# Patient Record
Sex: Male | Born: 1985
Health system: Southern US, Community
[De-identification: ages and names within clinical notes are randomized; demographics above are authoritative.]

## PROBLEM LIST (undated history)

## (undated) HISTORY — PX: APPENDECTOMY: SHX54

---

## 2008-08-23 ENCOUNTER — Emergency Department (HOSPITAL_COMMUNITY): Admission: EM | Admit: 2008-08-23 | Discharge: 2008-08-23 | Payer: Self-pay | Admitting: Emergency Medicine

## 2014-06-20 ENCOUNTER — Ambulatory Visit: Payer: Self-pay

## 2014-07-20 ENCOUNTER — Ambulatory Visit: Payer: Self-pay

## 2014-08-30 ENCOUNTER — Encounter (HOSPITAL_COMMUNITY): Payer: Self-pay

## 2014-08-30 ENCOUNTER — Emergency Department (HOSPITAL_COMMUNITY)
Admission: EM | Admit: 2014-08-30 | Discharge: 2014-08-30 | Disposition: A | Discharge status: Home / Self Care | Payer: Self-pay | Attending: Emergency Medicine | Admitting: Emergency Medicine

## 2014-08-30 ENCOUNTER — Emergency Department (HOSPITAL_COMMUNITY): Payer: Self-pay

## 2014-08-30 DIAGNOSIS — J159 Unspecified bacterial pneumonia: Secondary | ICD-10-CM | POA: Insufficient documentation

## 2014-08-30 DIAGNOSIS — J189 Pneumonia, unspecified organism: Secondary | ICD-10-CM

## 2014-08-30 DIAGNOSIS — R059 Cough, unspecified: Secondary | ICD-10-CM

## 2014-08-30 DIAGNOSIS — R05 Cough: Secondary | ICD-10-CM

## 2014-08-30 DIAGNOSIS — Z79899 Other long term (current) drug therapy: Secondary | ICD-10-CM | POA: Insufficient documentation

## 2014-08-30 MED ORDER — ALBUTEROL SULFATE HFA 108 (90 BASE) MCG/ACT IN AERS: 2.0000 | INHALATION_SPRAY | RESPIRATORY_TRACT | Status: AC | PRN

## 2014-08-30 MED ORDER — AZITHROMYCIN 250 MG PO TABS: 250.0000 mg | ORAL_TABLET | Freq: Every day | ORAL | Status: DC

## 2014-08-30 NOTE — ED Notes (Signed)
Pt pulled into hallway. Unable to esign. Pt verbalized understanding of discharge instructions.

## 2014-08-30 NOTE — ED Provider Notes (Signed)
CSN: [id number]     Arrival date & time 08/30/14  1134 History   First MD Initiated Contact with Patient 08/30/14 1213     Chief Complaint  Patient presents with  . Cough     (Consider location/radiation/quality/duration/timing/severity/associated sxs/prior Treatment) HPI  Daniel Klein is a 28 y.o. male without significant PMH presenting with a productive cough of white thick mucous for seven days. Pt notes small red streaks in sputum today. No other episodes. Patient also with tactile fevers, chills, sore throats generalized body aches and rhinorrhea. Patient also has chest tightness only after he coughs. Patient has no cardiac history. No history of COPD, smoking, asthma. No nausea or vomiting or abdominal pain. Patient is eating and drinking like normal. No history of blood clots, recent trauma, surgery or unilateral leg swelling.   History reviewed. No pertinent past medical history. Past Surgical History  Procedure Laterality Date  . Appendectomy     No family history on file. History  Substance Use Topics  . Smoking status: Never Smoker   . Smokeless tobacco: Not on file  . Alcohol Use: Yes     Comment: occ    Review of Systems  Constitutional: Negative for fever and chills.  HENT: Negative for congestion and rhinorrhea.   Eyes: Negative for visual disturbance.  Respiratory: Negative for cough and shortness of breath.   Cardiovascular: Negative for chest pain and palpitations.  Gastrointestinal: Negative for nausea, vomiting and diarrhea.  Musculoskeletal: Negative for back pain and gait problem.  Skin: Negative for rash.  Neurological: Negative for weakness and headaches.      Allergies  Review of patient's allergies indicates no known allergies.  Home Medications   Prior to Admission medications   Medication Sig Start Date End Date Taking? Authorizing Provider  ibuprofen (ADVIL,MOTRIN) 200 MG tablet Take 600 mg by mouth every 6 (six) hours as  needed.   Yes Historical Provider, MD  Pseudoeph-Doxylamine-DM-APAP (NYQUIL PO) Take 2 capsules by mouth at bedtime as needed.   Yes Historical Provider, MD  Pseudoephedrine-APAP-DM (DAYQUIL PO) Take 2 capsules by mouth daily.   Yes Historical Provider, MD  albuterol (PROVENTIL HFA;VENTOLIN HFA) 108 (90 BASE) MCG/ACT inhaler Inhale 2 puffs into the lungs every 4 (four) hours as needed for wheezing or shortness of breath. 08/30/14   Louann SjogrenVictoria L Seaton Hofmann, PA-C  azithromycin (ZITHROMAX) 250 MG tablet Take 1 tablet (250 mg total) by mouth daily. Take first 2 tablets together, then 1 every day until finished. 08/30/14   Benetta SparVictoria L Adrion Menz, PA-C   BP 132/75 mmHg  Pulse 59  Temp(Src) 98.7 F (37.1 C) (Oral)  Resp 19  Ht 5\' 11"  (1.803 m)  Wt 200 lb (90.719 kg)  BMI 27.91 kg/m2  SpO2 96% Physical Exam  Constitutional: He appears well-developed and well-nourished. No distress.  HENT:  Head: Normocephalic and atraumatic.  Nose: Right sinus exhibits no maxillary sinus tenderness and no frontal sinus tenderness. Left sinus exhibits no maxillary sinus tenderness and no frontal sinus tenderness.  Mouth/Throat: Mucous membranes are normal. Posterior oropharyngeal erythema present. No oropharyngeal exudate or posterior oropharyngeal edema.  Eyes: Conjunctivae and EOM are normal. Right eye exhibits no discharge. Left eye exhibits no discharge.  Neck: Normal range of motion. Neck supple.  Cardiovascular: Normal rate, regular rhythm and normal heart sounds.   Pulmonary/Chest: Effort normal. No respiratory distress. He has no wheezes.  Diffuse rales  Abdominal: Soft. Bowel sounds are normal. He exhibits no distension. There is no tenderness.  Lymphadenopathy:  He has no cervical adenopathy.  Neurological: He is alert.  Skin: Skin is warm and dry. He is not diaphoretic.  Nursing note and vitals reviewed.   ED Course  Procedures (including critical care time) Labs Review Labs Reviewed - No data to  display  Imaging Review Dg Chest 2 View  08/30/2014   CLINICAL DATA:  Productive cough.  Chest pain.  EXAM: CHEST  2 VIEW  COMPARISON:  None.  FINDINGS: Abnormal perihilar interstitial and nodular opacities, mildly more prominent on the right than the left. Low lung volumes. Cardiac and mediastinal margins appear normal. Central airway thickening noted.  IMPRESSION: 1. Airway thickening with perihilar interstitial and nodular opacities, right greater than left. Appearance favors atypical pneumonia with early bronchopneumonia as a less likely differential diagnostic consideration. If the patient has risk factors for aspiration been aspiration pneumonitis might also be considered.   Electronically Signed   By: Herbie BaltimoreWalt  Liebkemann M.D.   On: 08/30/2014 12:43     EKG Interpretation None      Meds given in ED:  Medications - No data to display  Discharge Medication List as of 08/30/2014  1:16 PM    START taking these medications   Details  albuterol (PROVENTIL HFA;VENTOLIN HFA) 108 (90 BASE) MCG/ACT inhaler Inhale 2 puffs into the lungs every 4 (four) hours as needed for wheezing or shortness of breath., Starting 08/30/2014, Until Discontinued, Print    azithromycin (ZITHROMAX) 250 MG tablet Take 1 tablet (250 mg total) by mouth daily. Take first 2 tablets together, then 1 every day until finished., Starting 08/30/2014, Until Discontinued, Print          MDM   Final diagnoses:  CAP (community acquired pneumonia)   Patient has been diagnosed with CAP via chest xray. Pt with chest tightness in the setting of PNA. Pt without cardiac history or chest pain and I doubt ACS. Pt also with one episode hemoptysis in the setting of PNA which I suspect is the cause. Pt is not ill appearing, immunocompromised, and does not have multiple co morbidities, therefore I feel like the they can be treated as an OP with abx therapy. PNA atypical and tx with azithromycin. Pt has been advised to return to the ED if  symptoms worsen or they do not improve. Pt to follow up with the wellness center. Pt verbalizes understanding and is agreeable with plan.        Louann SjogrenVictoria L Shawnika Pepin, PA-C 08/30/14 1955  Flint MelterElliott L Wentz, MD 08/31/14 [phone]

## 2014-08-30 NOTE — Discharge Instructions (Signed)
Return to the emergency room with worsening of symptoms, new symptoms or with symptoms that are concerning, especially persistent fevers after 24 hours, nausea, vomiting, unable to tolerate fluids, stiff neck, worsening headache, nausea/vomiting, visual changes or slurred speech, chest pain, shortness of breath, cough with blood. Please take all of your antibiotics until finished!   You may develop abdominal discomfort or diarrhea from the antibiotic.  You may help offset this with probiotics which you can buy or get in yogurt. Do not eat  or take the probiotics until 2 hours after your antibiotic.  Drink plenty of fluids with electrolytes especially Gatorade. OTC cold medications such as mucinex, nyquil, dayquil are recommended. Chloraseptic for sore throat. Follow up with wellness center in 3 days. Call to make appointment. Inhaler for shortness of breath or chest tightness.

## 2014-08-30 NOTE — ED Notes (Signed)
Pt has been coughing since last Wednesday and it isn't getting any better. Coughs hard and makes his back and chest sore when coughing.

## 2014-09-07 ENCOUNTER — Ambulatory Visit: Payer: Self-pay | Attending: Internal Medicine | Admitting: Internal Medicine

## 2014-09-07 ENCOUNTER — Encounter: Payer: Self-pay | Admitting: Internal Medicine

## 2014-09-07 VITALS — BP 110/75 | HR 66 | Temp 98.8°F | Resp 16 | Ht 70.0 in | Wt 218.0 lb

## 2014-09-07 DIAGNOSIS — Z114 Encounter for screening for human immunodeficiency virus [HIV]: Secondary | ICD-10-CM | POA: Insufficient documentation

## 2014-09-07 DIAGNOSIS — Z0000 Encounter for general adult medical examination without abnormal findings: Secondary | ICD-10-CM | POA: Insufficient documentation

## 2014-09-07 DIAGNOSIS — Z113 Encounter for screening for infections with a predominantly sexual mode of transmission: Secondary | ICD-10-CM | POA: Insufficient documentation

## 2014-09-07 LAB — COMPLETE METABOLIC PANEL WITH GFR
ALT: 40 U/L (ref 0–53)
AST: 23 U/L (ref 0–37)
Albumin: 4.8 g/dL (ref 3.5–5.2)
Alkaline Phosphatase: 71 U/L (ref 39–117)
BILIRUBIN TOTAL: 0.8 mg/dL (ref 0.2–1.2)
BUN: 13 mg/dL (ref 6–23)
CHLORIDE: 102 meq/L (ref 96–112)
CO2: 24 mEq/L (ref 19–32)
Calcium: 9.6 mg/dL (ref 8.4–10.5)
Creat: 0.85 mg/dL (ref 0.50–1.35)
GFR, Est Non African American: >89 mL/min
Glucose, Bld: 84 mg/dL (ref 70–99)
Potassium: 4.5 mEq/L (ref 3.5–5.3)
SODIUM: 138 meq/L (ref 135–145)
Total Protein: 7.5 g/dL (ref 6.0–8.3)

## 2014-09-07 LAB — CBC
HCT: 44.9 % (ref 39.0–52.0)
HEMOGLOBIN: 16.1 g/dL (ref 13.0–17.0)
MCH: 32.7 pg (ref 26.0–34.0)
MCHC: 35.9 g/dL (ref 30.0–36.0)
MCV: 91.3 fL (ref 78.0–100.0)
MPV: 9.1 fL — AB (ref 9.4–12.4)
PLATELETS: 348 10*3/uL (ref 150–400)
RBC: 4.92 MIL/uL (ref 4.22–5.81)
RDW: 13.4 % (ref 11.5–15.5)
WBC: 6.9 10*3/uL (ref 4.0–10.5)

## 2014-09-07 LAB — LIPID PANEL
CHOL/HDL RATIO: 4.4 ratio
Cholesterol: 133 mg/dL (ref 0–200)
HDL: 30 mg/dL — ABNORMAL LOW (ref >39)
LDL CALC: 46 mg/dL (ref 0–99)
Triglycerides: 285 mg/dL — ABNORMAL HIGH (ref <150)
VLDL: 57 mg/dL — AB (ref 0–40)

## 2014-09-07 LAB — POCT URINALYSIS DIPSTICK
BILIRUBIN UA: NEGATIVE
Blood, UA: NEGATIVE
Glucose, UA: NEGATIVE
KETONES UA: NEGATIVE
LEUKOCYTES UA: NEGATIVE
Nitrite, UA: NEGATIVE
PROTEIN UA: NEGATIVE
SPEC GRAV UA: 1.02
Urobilinogen, UA: 0.2
pH, UA: 5.5

## 2014-09-07 LAB — POCT GLYCOSYLATED HEMOGLOBIN (HGB A1C): Hemoglobin A1C: 5.5

## 2014-09-07 LAB — GLUCOSE, POCT (MANUAL RESULT ENTRY): POC Glucose: 98 mg/dl (ref 70–99)

## 2014-09-07 NOTE — Progress Notes (Signed)
Patient ID: Daniel Klein, male   DOB: 1986-05-06, 28 y.o.   MRN: [id number]  [id number]  [id number]  DOB - 1986-05-06  CC:  Chief Complaint  Patient presents with  . Establish Care       HPI: Daniel Klein is a 28 y.o. male here today to establish medical care.  Patient was recently evaluated by the ER for community acquired pneumonia.  He has now completed all antibiotics (azithromycin).  He reports that he has has been having symptoms of frequent urination, dizziness, sweating for the past three months.     PMHx:pnemonia Surgical Hx: appendectomy Family history of hypertension and cancer---leukemia in brother Soc Hx: denies tobacco and illicit drug use. Occasional alcohol use.    No Known Allergies History reviewed. No pertinent past medical history. Current Outpatient Prescriptions on File Prior to Visit  Medication Sig Dispense Refill  . albuterol (PROVENTIL HFA;VENTOLIN HFA) 108 (90 BASE) MCG/ACT inhaler Inhale 2 puffs into the lungs every 4 (four) hours as needed for wheezing or shortness of breath. 1 Inhaler 0  . azithromycin (ZITHROMAX) 250 MG tablet Take 1 tablet (250 mg total) by mouth daily. Take first 2 tablets together, then 1 every day until finished. 6 tablet 0  . ibuprofen (ADVIL,MOTRIN) 200 MG tablet Take 600 mg by mouth every 6 (six) hours as needed.    . Pseudoeph-Doxylamine-DM-APAP (NYQUIL PO) Take 2 capsules by mouth at bedtime as needed.    . Pseudoephedrine-APAP-DM (DAYQUIL PO) Take 2 capsules by mouth daily.     No current facility-administered medications on file prior to visit.   Family History  Problem Relation Age of Onset  . Hypertension Mother   . Cancer Brother    History   Social History  . Marital Status: Single    Spouse Name: N/A    Number of Children: N/A  . Years of Education: N/A   Occupational History  . Not on file.   Social History Main Topics  . Smoking status: Never Smoker   . Smokeless tobacco:  Not on file  . Alcohol Use: Yes     Comment: occ  . Drug Use: Not on file  . Sexual Activity: Not on file   Other Topics Concern  . Not on file   Social History Narrative    .Review of Systems  Constitutional: Negative.   HENT: Negative.   Eyes: Negative.   Respiratory: Positive for cough and shortness of breath.   Cardiovascular: Negative.   Gastrointestinal: Positive for diarrhea.  Genitourinary: Negative.   Skin: Negative.   Neurological: Positive for tingling (left leg numbness for 4 months). Negative for dizziness, focal weakness and seizures.  Endo/Heme/Allergies: Negative.   Psychiatric/Behavioral: Negative.       Objective:   Filed Vitals:   09/07/14 1212  BP: 110/75  Pulse: 66  Temp: 98.8 F (37.1 C)  Resp: 16    Physical Exam: Constitutional: Patient appears well-developed and well-nourished. No distress. HENT: Normocephalic, atraumatic, External right and left ear normal. Oropharynx is clear and moist.  Eyes: Conjunctivae and EOM are normal. PERRLA, no scleral icterus. Neck: Normal ROM. Neck supple. No JVD. No tracheal deviation. No thyromegaly. CVS: RRR, S1/S2 +, no murmurs, no gallops, no carotid bruit.  Pulmonary: Effort and breath sounds normal, no stridor, rhonchi, wheezes, rales.  Abdominal: Soft. BS +, no distension, tenderness, rebound or guarding.  Musculoskeletal: Normal range of motion. No edema and no tenderness.  Lymphadenopathy: No lymphadenopathy noted, cervical, inguinal or axillary Neuro: Alert. Normal reflexes,  muscle tone coordination. No cranial nerve deficit. Skin: Skin is warm and dry. No rash noted. Not diaphoretic. No erythema. No pallor. Psychiatric: Normal mood and affect. Behavior, judgment, thought content normal. Physical Exam  Genitourinary: Testes normal. Cremasteric reflex is present.        No results found for: WBC, HGB, HCT, MCV, PLT No results found for: CREATININE, BUN, NA, K, CL, CO2  No results found for:  HGBA1C Lipid Panel  No results found for: CHOL, TRIG, HDL, CHOLHDL, VLDL, LDLCALC     Assessment and plan:   Daniel MausGerardo was seen today for establish care.  Diagnoses and associated orders for this visit:  Annual physical exam - Glucose (CBG) - HgB A1c - POCT urinalysis dipstick - Lipid panel - CBC - COMPLETE METABOLIC PANEL WITH GFR  Screening for STD (sexually transmitted disease) - GC/chlamydia probe amp, urine - HIV antibody (with reflex) - RPR   May follow up if symptoms do not improve.  The patient was given clear instructions to go to ER or return to medical center if symptoms don't improve, worsen or new problems develop. The patient verbalized understanding.   Due to language barrier, an interpreter was present during the history-taking and subsequent discussion (and for part of the physical exam) with this patient.    Daniel CommonsKECK, Daniel Whitefield, NP-C [facility] [phone] 09/07/2014, 12:29 PM

## 2014-09-07 NOTE — Progress Notes (Signed)
Pr is here to establish care. Pt was in the ED recently and was diagnosed w/ pneumonia. Pt is currently fasting. Pt wants to be tested for diabetes and HTN.

## 2014-09-08 LAB — RPR

## 2014-09-08 LAB — GC/CHLAMYDIA PROBE AMP, URINE
Chlamydia, Swab/Urine, PCR: NEGATIVE
GC Probe Amp, Urine: NEGATIVE

## 2014-09-08 LAB — HIV ANTIBODY (ROUTINE TESTING W REFLEX): HIV: NONREACTIVE

## 2014-09-12 ENCOUNTER — Telehealth: Payer: Self-pay | Admitting: Internal Medicine

## 2014-09-12 ENCOUNTER — Telehealth: Payer: Self-pay | Admitting: *Deleted

## 2014-09-12 NOTE — Telephone Encounter (Signed)
Pt.'s wife called to request results. Please f/u with pt.

## 2014-09-12 NOTE — Telephone Encounter (Signed)
Pt is aware of his lab results. 

## 2016-12-10 ENCOUNTER — Ambulatory Visit: Payer: Self-pay

## 2016-12-10 ENCOUNTER — Ambulatory Visit: Payer: Self-pay | Admitting: Family Medicine

## 2016-12-28 ENCOUNTER — Ambulatory Visit (HOSPITAL_COMMUNITY)
Admission: RE | Admit: 2016-12-28 | Discharge: 2016-12-28 | Disposition: A | Discharge status: Home / Self Care | Payer: Self-pay | Source: Ambulatory Visit | Attending: Family Medicine | Admitting: Family Medicine

## 2016-12-28 ENCOUNTER — Telehealth: Payer: Self-pay | Admitting: Family Medicine

## 2016-12-28 ENCOUNTER — Ambulatory Visit: Payer: Self-pay | Attending: Family Medicine | Admitting: Family Medicine

## 2016-12-28 VITALS — BP 144/73 | HR 63 | Temp 98.0°F | Resp 18 | Ht 70.0 in | Wt 236.2 lb

## 2016-12-28 DIAGNOSIS — R202 Paresthesia of skin: Secondary | ICD-10-CM

## 2016-12-28 DIAGNOSIS — M238X1 Other internal derangements of right knee: Secondary | ICD-10-CM | POA: Insufficient documentation

## 2016-12-28 DIAGNOSIS — M1711 Unilateral primary osteoarthritis, right knee: Secondary | ICD-10-CM | POA: Insufficient documentation

## 2016-12-28 DIAGNOSIS — M25562 Pain in left knee: Secondary | ICD-10-CM | POA: Insufficient documentation

## 2016-12-28 DIAGNOSIS — R200 Anesthesia of skin: Secondary | ICD-10-CM | POA: Insufficient documentation

## 2016-12-28 DIAGNOSIS — Z23 Encounter for immunization: Secondary | ICD-10-CM

## 2016-12-28 DIAGNOSIS — M25561 Pain in right knee: Secondary | ICD-10-CM | POA: Insufficient documentation

## 2016-12-28 DIAGNOSIS — G8929 Other chronic pain: Secondary | ICD-10-CM

## 2016-12-28 MED ORDER — IBUPROFEN 800 MG PO TABS: 800.0000 mg | ORAL_TABLET | Freq: Three times a day (TID) | ORAL | 0 refills | Status: DC | PRN

## 2016-12-28 MED FILL — IBUPROFEN 800 MG TABLET: 800 | 13 days supply | Qty: 40 | Fill #0

## 2016-12-28 NOTE — Progress Notes (Signed)
Subjective:  Patient ID: Daniel Klein, male    DOB: 10-Dec-1985  Age: 31 y.o. MRN: [id number]  CC: Establish Care   HPI Daniel Klein presents for  Right knee pain: 2 months. Denies injury. 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 Symptoms include crepitius when bending forward and parathesias from the knee down the right lower extremity . Denies any swelling and tenderness. Pain 8/10 most days. Reports taking OTC ibuprofen for pain.    Outpatient Medications Prior to Visit  Medication Sig Dispense Refill  . ibuprofen (ADVIL,MOTRIN) 200 MG tablet Take 600 mg by mouth every 6 (six) hours as needed.    Marland Kitchen albuterol (PROVENTIL HFA;VENTOLIN HFA) 108 (90 BASE) MCG/ACT inhaler Inhale 2 puffs into the lungs every 4 (four) hours as needed for wheezing or shortness of breath. 1 Inhaler 0  . azithromycin (ZITHROMAX) 250 MG tablet Take 1 tablet (250 mg total) by mouth daily. Take first 2 tablets together, then 1 every day until finished. 6 tablet 0  . Pseudoeph-Doxylamine-DM-APAP (NYQUIL PO) Take 2 capsules by mouth at bedtime as needed.    . Pseudoephedrine-APAP-DM (DAYQUIL PO) Take 2 capsules by mouth daily.     No facility-administered medications prior to visit.     ROS Review of Systems  Respiratory: Negative.   Cardiovascular: Negative.   Musculoskeletal: Positive for arthralgias.  Skin: Negative.     Objective:  BP (!) 144/73 (BP Location: Left Arm, Patient Position: Sitting, Cuff Size: Normal)   Pulse 63   Temp 98 F (36.7 C) (Oral)   Resp 18   Ht 5\' 10"  (1.778 m)   Wt 236 lb 3.2 oz (107.1 kg)   SpO2 98%   BMI 33.89 kg/m   BP/Weight 12/28/2016 09/07/2014 08/30/2014  Systolic BP 144 110 132  Diastolic BP 73 75 75  Wt. (Lbs) 236.2 218 200  BMI 33.89 31.28 27.91    Physical Exam  Cardiovascular: Normal rate, regular rhythm, normal heart sounds and intact distal pulses.   Pulses:      Dorsalis pedis pulses are 2+ on the right side, and 2+ on the left  side.       Posterior tibial pulses are 2+ on the right side, and 2+ on the left side.  Pulmonary/Chest: Effort normal and breath sounds normal.  Musculoskeletal:       Right knee: He exhibits bony tenderness. He exhibits no swelling.       Left knee: Normal.  Right knee pain with extension and flexion.   Skin: Skin is warm and dry.  Nursing note and vitals reviewed.   Assessment & Plan:   Problem List Items Addressed This Visit    None    Visit Diagnoses    Crepitus of joint of right knee    -  Primary   Relevant Orders   DG Knee Complete 4 Views Right (Completed)   Ambulatory referral to Orthopedics   Chronic pain of left knee       -Script for right knee brace given.    Relevant Medications   ibuprofen (ADVIL,MOTRIN) 800 MG tablet   Other Relevant Orders   DG Knee Complete 4 Views Right (Completed)   Ambulatory referral to Orthopedics   Numbness and tingling       Relevant Orders   DG Knee Complete 4 Views Right (Completed)   Ambulatory referral to Orthopedics   Needs flu shot       Relevant Orders   Flu Vaccine QUAD 36+ mos PF IM (Fluarix & Fluzone  Quad PF) (Completed)      Meds ordered this encounter  Medications  . ibuprofen (ADVIL,MOTRIN) 800 MG tablet    Sig: Take 1 tablet (800 mg total) by mouth every 8 (eight) hours as needed (Take with food.).    Dispense:  40 tablet    Refill:  0    Order Specific Question:   Supervising Provider    Answer:   Quentin AngstJEGEDE, OLUGBEMIGA E [id number]]    Follow-up: Return if symptoms worsen or fail to improve.   Lizbeth BarkMandesia R Ladon Vandenberghe FNP

## 2016-12-28 NOTE — Progress Notes (Signed)
Patient complains right knee pain all the way to his foot   Patient takes ibuprofen for the pain  Patient has not eaten today  Patient is not taking any current meds

## 2016-12-28 NOTE — Patient Instructions (Addendum)
Apply for orange card to complete referral process    Dolor en las articulaciones (Joint Pain) El dolor en las articulaciones, que tambin se conoce como artralgia, puede tener muchas causas. Suele desaparecer cuando se siguen las indicaciones del mdico en lo que respecta a Engineer, materials en la casa. Sin embargo, tambin puede deberse a enfermedades que requieren otros tratamientos. Las causas comunes del dolor en las articulaciones incluyen lo siguiente:  Hematomas en la zona de la articulacin.  Uso excesivo de Nurse, learning disability.  Desgaste normal de las articulaciones que ocurre con la edad (artrosis).  Otras formas diferentes de artritis.  La acumulacin de cristales de cido rico en la articulacin (gota).  Infecciones de la articulacin (artritis sptica) o del hueso (osteomielitis). El mdico puede recomendar medicamentos para Engineer, materials. Si el dolor en las articulaciones persiste, tal vez haya que realizar ms estudios para Scientist, forensic. INSTRUCCIONES PARA EL CUIDADO EN EL HOGAR Controle su afeccin para ver si hay cambios. Siga estas instrucciones como se lo hayan indicado para aliviar el dolor que est sintiendo.  Tome los medicamentos solamente como se lo haya indicado el mdico.  Mantenga en reposo la zona afectada durante el tiempo que el mdico le haya indicado. Si se lo indican, levante la articulacin dolorida por encima del nivel del corazn cuando est sentado o acostado.  No haga cosas que le causen dolor o que lo intensifiquen.  Si se lo indican, aplique hielo sobre la zona dolorida:  Ponga el hielo en una bolsa plstica.  Coloque una toalla entre la piel y la bolsa de hielo.  Coloque el hielo durante , 2 a 3veces por Futures trader.  Use una venda elstica, una frula o un cabestrillo como se lo haya indicado el mdico. Afloje la venda elstica o la frula si los dedos de las manos o de los pies se le adormecen, siente hormigueo o se le  enfran y se tornan de Research officer, trade union.  Comience a hacer ejercicios o a elongar la zona afectada como se lo haya indicado el mdico. Consulte al mdico qu tipos de ejercicios son seguros para usted.  Concurra a todas las visitas de control como se lo haya indicado el mdico. Esto es importante. SOLICITE ATENCIN MDICA SI:  El dolor aumenta y los medicamentos no lo [place].  El dolor en la articulacin no mejora en el trmino de 3das.  El hematoma o la hinchazn aumentan.  Tiene fiebre.  Adelgaza 10 libras (4,5 kg) o ms, sin proponrselo. SOLICITE ATENCIN MDICA DE INMEDIATO SI:  No puede mover la articulacin.  Los dedos de las manos o de los pies se le adormecen o se le enfran y se tornan de Research officer, trade union. Esta informacin no tiene Theme park manager el consejo del mdico. Asegrese de hacerle al mdico cualquier pregunta que tenga. Document Released: 10/05/2005 Document Revised: 10/26/2014 Document Reviewed: 07/17/2014 Elsevier Interactive Patient Education  2017 Elsevier Inc.   Google musculoesqueltico (Musculoskeletal Pain) El dolor musculoesqueltico comprende dolores y Associate Professor en los msculos y en los [place]. Este dolor puede ocurrir en cualquier parte del cuerpo. INSTRUCCIONES PARA EL CUIDADO EN EL HOGAR  Solo tome medicamentos para calmar el dolor, el malestar o bajar la fiebre, segn las indicaciones del mdico.  Podr seguir con todas las actividades a menos que stas le ocasionen ms [employer] Cuando el dolor disminuya, retome las actividades habituales lentamente. Aumente gradualmente la intensidad y la duracin de sus actividades o del ejercicio.  Durante los perodos de  dolor intenso, el reposo en cama puede ser beneficioso. Acustese o sintese en cualquier posicin que sea cmoda, pero salga de la cama y camine al menos cada varias horas.  Si se lo indican, aplique hielo sobre la zona de la lesin.  Ponga el hielo en una bolsa plstica.  Coloque una toalla  entre la piel y la bolsa de hielo.  Coloque el hielo durante 20minutos, 2 a 3veces por Futures traderda. SOLICITE ATENCIN MDICA SI:  El dolor empeora.  El dolor no se alivia con los United Parcelmedicamentos.  Pierde la funcionalidad en la zona del dolor si este se manifiesta en los brazos, las piernas o el cuello. Esta informacin no tiene Theme park managercomo fin reemplazar el consejo del mdico. Asegrese de hacerle al mdico cualquier pregunta que tenga. Document Released: 07/15/2005 Document Revised: 12/28/2011 Document Reviewed: 06/09/2013 Elsevier Interactive Patient Education  2017 [employer]

## 2016-12-28 NOTE — Telephone Encounter (Signed)
Pt called to request a location to where she may but med supplies that were suggested by Arrie SenateMandesia Hairston. States that she went to the address that she was given but the supplies costed too much and they have no insurance to pay for them. Would like alternate locations to purchase supplies. Please f/u with pt.

## 2016-12-29 NOTE — Telephone Encounter (Signed)
CMA call to advice patient where to go to get the knee brace  Patient was aware and understood

## 2016-12-29 NOTE — Telephone Encounter (Signed)
Ok I will  

## 2016-12-29 NOTE — Telephone Encounter (Signed)
Please notify patient he can could try [facility] for medical supplies or [facility], otherwise he will need to shop around.

## 2016-12-29 NOTE — Telephone Encounter (Signed)
Spoke with wife & she said that [facility] but [facility] not [facility] it  & there were the one who send them to the place where it was expensive .

## 2016-12-30 ENCOUNTER — Telehealth: Payer: Self-pay

## 2016-12-30 NOTE — Telephone Encounter (Signed)
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 No evidence of joint swelling or bone abnormality.

## 2016-12-30 NOTE — Telephone Encounter (Signed)
CMA call to inform patient about x ray results  Patient was aware and understood

## 2017-01-04 ENCOUNTER — Ambulatory Visit: Payer: Self-pay | Attending: Family Medicine

## 2017-01-04 ENCOUNTER — Other Ambulatory Visit: Payer: Self-pay | Admitting: Family Medicine

## 2017-01-04 DIAGNOSIS — M1711 Unilateral primary osteoarthritis, right knee: Secondary | ICD-10-CM

## 2017-01-04 DIAGNOSIS — M25561 Pain in right knee: Secondary | ICD-10-CM

## 2017-01-04 MED ORDER — TRAMADOL HCL 50 MG PO TABS: 50.0000 mg | ORAL_TABLET | Freq: Three times a day (TID) | ORAL | 0 refills | Status: DC | PRN

## 2017-01-04 MED FILL — traMADol HCL 50 MG TABS: 50 | 10 days supply | Qty: 30 | Fill #0

## 2017-01-04 NOTE — Telephone Encounter (Signed)
Pt. Came into facility stating that the medication that was given to him for his pain is not helping him. Please f/u

## 2017-01-04 NOTE — Telephone Encounter (Signed)
Please notify patient prescription for Tramadol will be given. Prior to giving to patient please have him sign the CSA form. Recommend following up with orthopedic referral as well.

## 2017-01-04 NOTE — Telephone Encounter (Signed)
Pt. Came into facility stating that the medication that was given to him for his pain is not helping him. Please f/u

## 2017-01-04 NOTE — Telephone Encounter (Signed)
CMA call patient to inform that his Rx is ready to pick up & that he will need to sign the controlled substance agreement   Spoke with patient wife   She was aware and understood

## 2017-01-08 ENCOUNTER — Encounter: Payer: Self-pay | Admitting: Family Medicine

## 2017-01-11 ENCOUNTER — Ambulatory Visit
Admission: RE | Admit: 2017-01-11 | Discharge: 2017-01-11 | Disposition: A | Discharge status: Home / Self Care | Payer: Self-pay | Source: Ambulatory Visit | Attending: Sports Medicine | Admitting: Sports Medicine

## 2017-01-11 ENCOUNTER — Ambulatory Visit (INDEPENDENT_AMBULATORY_CARE_PROVIDER_SITE_OTHER): Payer: Self-pay | Admitting: Sports Medicine

## 2017-01-11 ENCOUNTER — Encounter: Payer: Self-pay | Admitting: Sports Medicine

## 2017-01-11 ENCOUNTER — Ambulatory Visit: Payer: Self-pay

## 2017-01-11 VITALS — BP 153/82 | HR 60 | Ht 70.0 in | Wt 220.0 lb

## 2017-01-11 DIAGNOSIS — M79604 Pain in right leg: Secondary | ICD-10-CM

## 2017-01-11 DIAGNOSIS — M79661 Pain in right lower leg: Secondary | ICD-10-CM

## 2017-01-11 NOTE — Assessment & Plan Note (Addendum)
Patient with right leg pain. Unclear etiology at this time. He had a right knee x-ray about 2 weeks ago which was basically normal except for some patellofemoral degenerative changes. I doubt this is a contributing factor to his pain. Numbness and tingling over his right lower leg with history of sudden abduction injury about 5 months ago is concerning for peroneal nerve etiology with or without fibular head fracture. However, his nondermatomal diminished light sensation and normal motor strength argues against this. Tenderness to palpation over his tibial bone and multiple bony parts of his ankle joint is concerning for contusion injury pr fracture. However, it is unlikely to have multiple bone fractures at the same time. He could have arthritic changes.  He has no joint laxity to suggest significant ligamentous injury. He has no constitutional symptoms except for nighttime sweats to think of inflammatory processes.   Will try naproxen 500 mg twice a day  DG Tibia/fibula right, AP/lateral  DG ankle complete (AP, lateral, mortise)  Will call with x-ray results

## 2017-01-11 NOTE — Progress Notes (Signed)
Subjective:    Daniel Klein is a 31 y.o. old male here for Right leg pain  In person Spanish interpreter by the name Lennox Pippins was used for the whole of this encounter.   HPI Patient reports having right leg pain for 5 months. He says that he was pushing concrete cement with his right foot when someone suddenly abducted his right leg and he felt pain in the right leg. He was able to walk after the incident but with pain. Initially denied swelling to the knee but admitted swelling to Dr. Margaretha Sheffield. Pain was initially on and off for the first 2 months, then became constant for the last 3 months. Pain over his anterior shin from knee down to his ankle. Pain is worse over his ankle anteriorly. He described the pain as stabbing proximally and burning distally. Pain is worse with walking long distance. He tried tramadol about 2 weeks ago that helped with the pain a little bit. No other noticeable alleviating factor. He also reports numbness and tingling in his right lower leg for one month.  Off note, he has had an x-ray of his right knee 2 weeks ago which was negative except for mild patellofemoral degenerative changes.   He denies fever or chills. He reports nighttime sweating at baseline. Denies weight loss. Denies waking up with pain.  PMH/Problem List: has Right leg pain on his problem list.   has no past medical history on file.  FH:  Family History  Problem Relation Age of Onset  . Hypertension Mother   . Cancer Brother     [facility] Social History  Substance Use Topics  . Smoking status: Never Smoker  . Smokeless tobacco: Never Used  . Alcohol use Yes     Comment: occ    Review of Systems Review of systems negative except for pertinent positives and negatives in history of present illness above.     Objective:     Vitals:   01/11/17 0911  BP: (!) 153/82  Pulse: 60  Weight: 220 lb (99.8 kg)  Height: 5\' 10"  (1.778 m)    Physical Exam GEN: appears well, no apparent  distress. CVS: 2+ DP & PT pulses bilaterally, cap refills < 2 secs RESP: speaks in full sentence, no IWOB MSK:  Right knee:   Normal to inspection with no erythema or effusion or obvious bony abnormalities. Appears symmetric to the left  No warmth to touch, joint line or condyle tenderness. Mild tenderness over the medial aspect below joint line.   ROM full in flexion and extension and lower leg rotation.  Ligaments with solid consistent endpoints including ACL, PCL, LCL, MCL. Right Ankle:  No visible erythema, swelling or deformity. Appears symmetric. Mild pes planus.   Tenderness to palpation over his tibial bone from knee down to his uncle anteriorly, anterior aspect of his ankle, lateral aspect behind his lateral malleolus and over his lateral and medial malleolus.   No tenderness over the base of 5th MT or navicular prominence.   Range of motion is full in all directions.  Strength is 5/5 in all directions (dorsal & plantar flexion, abduction & adduction, eversion and inversion).   Pain with resisted dorsiflexion, eversion and inversion in his ankles.   Nondermatomal diminished light sensation in his right lower extremity. Patellar and Achille reflex 2+ bilaterally. Negative tarsal tunnel tinel's.   No sign of peroneal tendon subluxations but tenderness to palpation over that area as well.  2+ DP & PT pulses bilaterally, cap  refills < 2 secs SKIN: no apparent skin lesion    Assessment and Plan:  Right leg pain Patient with right leg pain. Unclear etiology at this time. He had a right knee x-ray about 2 weeks ago which was basically normal except for some patellofemoral degenerative changes. I doubt this is a contributing factor to his pain. Numbness and tingling over his right lower leg with history of sudden abduction injury about 5 months ago is concerning for peroneal nerve etiology with or without fibular head fracture. However, his nondermatomal diminished light  sensation and normal motor strength argues against this. Tenderness to palpation over his tibial bone and multiple bony parts of his ankle joint is concerning for contusion injury pr fracture. However, it is unlikely to have multiple bone fractures at the same time. He could have arthritic changes.  He has no joint laxity to suggest significant ligamentous injury. He has no constitutional symptoms except for nighttime sweats to think of inflammatory processes.   Will try naproxen 500 mg twice a day  DG Tibia/fibula right, AP/lateral  DG ankle complete (AP, lateral, mortise)  Orders Placed This Encounter  Procedures  . DG Tibia/Fibula Right    Standing Status:   Future    Number of Occurrences:   1    Standing Expiration Date:   03/13/2018    Scheduling Instructions:     AP, LATERAL    Order Specific Question:   Reason for Exam (SYMPTOM  OR DIAGNOSIS REQUIRED)    Answer:   right leg pain    Order Specific Question:   Preferred imaging location?    Answer:   [facility]  . DG Ankle Complete Right    Standing Status:   Future    Number of Occurrences:   1    Standing Expiration Date:   03/13/2018    Scheduling Instructions:     AP, LATERAL, MORTISE    Order Specific Question:   Reason for Exam (SYMPTOM  OR DIAGNOSIS REQUIRED)    Answer:   RIGHT LEG PAIN    Order Specific Question:   Preferred imaging location?    Answer:   [facility]   Almon Herculesaye T Gonfa, MD 01/11/17 Pager: [phone]   Patient seen and evaluated with the resident. I agree with the above plan of care. X-rays including right knee, right tib-fib, and right ankle show no acute findings. He may have some mild degenerative changes in his right ankle. We will try him on some naproxen sodium and I'll see him back in the office in 3 weeks for reevaluation.

## 2017-01-13 ENCOUNTER — Telehealth: Payer: Self-pay | Admitting: Family Medicine

## 2017-01-13 NOTE — Telephone Encounter (Signed)
Patient called the office asking to speak with PCP regarding his condition. Pt was seen earlier by provide and was informed that he might have arthritis. Then he was referred to specialist and they informed him that he may have a fracture but then xray results didn't show that. Pt is still in pain and upset. Please follow up. ° °Thank you.  °

## 2017-01-13 NOTE — Telephone Encounter (Signed)
All x-ray results are interpreted by a radiologist. Whatever is interpreted is then shared with the patient. He was referred to orthopedics. I recommend that he follow up with their recommendations.

## 2017-01-13 NOTE — Telephone Encounter (Signed)
Patient called the office asking to speak with PCP regarding his condition. Pt was seen earlier by provide and was informed that he might have arthritis. Then he was referred to specialist and they informed him that he may have a fracture but then xray results didn't show that. Pt is still in pain and upset. Please follow up.  Thank you.

## 2017-01-14 ENCOUNTER — Encounter: Payer: Self-pay | Admitting: Family Medicine

## 2017-01-14 NOTE — Telephone Encounter (Signed)
CMA Call to inform patient about results   Patient was aware and understood

## 2017-01-22 ENCOUNTER — Telehealth: Payer: Self-pay | Admitting: Family Medicine

## 2017-01-22 ENCOUNTER — Other Ambulatory Visit: Payer: Self-pay | Admitting: Family Medicine

## 2017-01-22 DIAGNOSIS — M25561 Pain in right knee: Secondary | ICD-10-CM

## 2017-01-22 DIAGNOSIS — M1711 Unilateral primary osteoarthritis, right knee: Secondary | ICD-10-CM

## 2017-01-22 DIAGNOSIS — M25562 Pain in left knee: Secondary | ICD-10-CM

## 2017-01-22 DIAGNOSIS — G8929 Other chronic pain: Secondary | ICD-10-CM

## 2017-01-22 MED ORDER — TRAMADOL HCL 50 MG PO TABS: 50.0000 mg | ORAL_TABLET | Freq: Three times a day (TID) | ORAL | 0 refills | Status: DC | PRN

## 2017-01-22 MED ORDER — IBUPROFEN 800 MG PO TABS: 800.0000 mg | ORAL_TABLET | Freq: Three times a day (TID) | ORAL | 0 refills | Status: DC | PRN

## 2017-01-22 MED FILL — IBUPROFEN 800 MG TABLET: 800 | 10 days supply | Qty: 30 | Fill #0

## 2017-01-22 NOTE — Telephone Encounter (Signed)
Pt. Called requesting a refill on Tramadol and ibuprofen (ADVIL,MOTRIN) 800 MG tablet. Pt. States that he is out of medication and he is in pain. Please f/u

## 2017-01-22 NOTE — Telephone Encounter (Signed)
CMA call patient regarding prescription being fill & ready to pick up at pharmacy & one at the front desk  Also if additional refill needs to make an office visit    Spoke with wife was aware and understood

## 2017-01-22 NOTE — Telephone Encounter (Signed)
Please notify patient that refills for both medications will be placed. However no additional refills on Tramadol will be given again without an office visit first.

## 2017-01-25 MED FILL — traMADol HCL 50 MG TABS: 50 | 10 days supply | Qty: 30 | Fill #0

## 2017-01-27 ENCOUNTER — Telehealth: Payer: Self-pay | Admitting: Family Medicine

## 2017-01-27 NOTE — Telephone Encounter (Signed)
Pt called the office to speak with nurse, in regards to getting lab work done. Patient stated the PCP asked him to come back to get his HTN checked up I dont' see any orders on patients file.  I checked on his last visit to see if you had told him to comeback for a BP check & it only says return if worsen or fail to improve. Please advice?

## 2017-01-27 NOTE — Telephone Encounter (Signed)
Pt called the office to speak with nurse, in regards to getting lab work done. Patient stated the PCP asked him to come back to get his HTN checked up I dont' see any orders on patients file. Please follow up.  Thank you.

## 2017-01-28 ENCOUNTER — Encounter: Payer: Self-pay | Admitting: Family Medicine

## 2017-01-28 NOTE — Telephone Encounter (Signed)
It was discussed at last visit for patient to schedule a follow up appointment for to evaluate elevated BP. No orders for labs were placed. Have to him to schedule an appointment.

## 2017-01-28 NOTE — Telephone Encounter (Signed)
CMA call patient to inform about making an appt with the provider regarding his BP being high  Patient did not answer but left a detailed message to call us back to make that appt scheduled

## 2017-02-01 ENCOUNTER — Ambulatory Visit (INDEPENDENT_AMBULATORY_CARE_PROVIDER_SITE_OTHER): Payer: Self-pay | Admitting: Sports Medicine

## 2017-02-01 ENCOUNTER — Encounter: Payer: Self-pay | Admitting: Sports Medicine

## 2017-02-01 VITALS — BP 146/88 | Ht 70.0 in | Wt 220.0 lb

## 2017-02-01 DIAGNOSIS — M25571 Pain in right ankle and joints of right foot: Secondary | ICD-10-CM

## 2017-02-01 MED ORDER — TRAMADOL HCL 50 MG PO TABS: ORAL_TABLET | ORAL | 0 refills | Status: DC

## 2017-02-01 MED FILL — traMADol HCL 50 MG TABS: 50 | 15 days supply | Qty: 90 | Fill #0

## 2017-02-01 NOTE — Progress Notes (Signed)
   Subjective:    Patient ID: Daniel Klein, male    DOB: Oct 18, 1986, 31 y.o.   MRN: [id number]  HPI   Patient comes in today for follow-up on right lower leg pain. X-rays of the right tib-fib and right ankle are unremarkable. He continues to localize his pain primarily to the ankle, especially the lateral aspect of the ankle. He is getting intermittent swelling as well. His pain will radiate up the lower leg to the knee. He denies any swelling. He is taking 100 mg of tramadol as needed and this is only minimally effective. He has not tried any sort of brace.    Review of Systems    As above  Objective:   Physical Exam  Well-developed, well-nourished. No acute distress  Right knee: Full range of motion. No effusion. 1+ patellofemoral crepitus. Knee is stable to valgus and varus stressing. Negative anterior drawer, negative posterior drawer. No joint line tenderness. Negative McMurray's.  Right ankle: Limited range of motion secondary to pain. No obvious effusion. He is tender to palpation along the lateral joint line. Good ankle stability with anterior drawer and talar tilt testing. Deltoid ligament is stable. Neurovascularly intact distally. Walking with a slight limp.      Assessment & Plan:  Right ankle pain-rule out OCD  MRI of the right ankle specifically to rule out an osteochondral defect. We will try a med spec brace and I will refill his tramadol. Patient will follow-up with me after the MRI to discuss those results and delineate further treatment.

## 2017-02-01 NOTE — Patient Instructions (Signed)
MRI at [facility] [address]  Tuesday 02/02/17 at [employer] [facility] in the [place] at [phone]  Phone: [phone]

## 2017-02-02 ENCOUNTER — Ambulatory Visit (HOSPITAL_COMMUNITY)
Admission: RE | Admit: 2017-02-02 | Discharge: 2017-02-02 | Disposition: A | Discharge status: Home / Self Care | Payer: Self-pay | Source: Ambulatory Visit | Attending: Sports Medicine | Admitting: Sports Medicine

## 2017-02-02 DIAGNOSIS — M25571 Pain in right ankle and joints of right foot: Secondary | ICD-10-CM | POA: Insufficient documentation

## 2017-02-02 DIAGNOSIS — M25871 Other specified joint disorders, right ankle and foot: Secondary | ICD-10-CM | POA: Insufficient documentation

## 2017-02-04 ENCOUNTER — Ambulatory Visit: Payer: Self-pay | Admitting: Sports Medicine

## 2017-02-05 ENCOUNTER — Telehealth: Payer: Self-pay | Admitting: Sports Medicine

## 2017-02-05 DIAGNOSIS — M25571 Pain in right ankle and joints of right foot: Principal | ICD-10-CM

## 2017-02-05 DIAGNOSIS — G8929 Other chronic pain: Secondary | ICD-10-CM

## 2017-02-05 NOTE — Addendum Note (Signed)
Addended by: Annita Brod on: 02/05/2017 01:52 PM   Modules accepted: Orders

## 2017-02-05 NOTE — Telephone Encounter (Signed)
Patient canceled his follow-up visit yesterday to discuss MRI findings of his right ankle. 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 Based on these findings I have recommended consultation with Dr. Lajoyce Corners. I will have my office contact him after this appointment is scheduled. I will defer further workup and treatment to the discretion of Dr. Lajoyce Corners and the patient will follow-up with me as needed.

## 2017-02-09 ENCOUNTER — Ambulatory Visit (INDEPENDENT_AMBULATORY_CARE_PROVIDER_SITE_OTHER): Payer: Self-pay | Admitting: Orthopaedic Surgery

## 2017-02-09 ENCOUNTER — Encounter (INDEPENDENT_AMBULATORY_CARE_PROVIDER_SITE_OTHER): Payer: Self-pay | Admitting: Orthopaedic Surgery

## 2017-02-09 ENCOUNTER — Encounter (INDEPENDENT_AMBULATORY_CARE_PROVIDER_SITE_OTHER): Payer: Self-pay

## 2017-02-09 DIAGNOSIS — M1711 Unilateral primary osteoarthritis, right knee: Secondary | ICD-10-CM

## 2017-02-09 DIAGNOSIS — M25571 Pain in right ankle and joints of right foot: Secondary | ICD-10-CM

## 2017-02-09 MED ORDER — METHYLPREDNISOLONE ACETATE 40 MG/ML IJ SUSP
40.0000 mg | INTRAMUSCULAR | Status: AC | PRN
  Administered 2017-02-09: 40 mg via INTRA_ARTICULAR

## 2017-02-09 MED ORDER — IBUPROFEN 800 MG PO TABS
800.0000 mg | ORAL_TABLET | Freq: Three times a day (TID) | ORAL | 0 refills | Status: DC | PRN
Start: 2017-02-09 — End: 2017-03-09

## 2017-02-09 MED ORDER — LIDOCAINE HCL 1 % IJ SOLN
1.0000 mL | INTRAMUSCULAR | Status: AC | PRN
  Administered 2017-02-09: 1 mL

## 2017-02-09 MED ORDER — TRAMADOL HCL 50 MG PO TABS: 50.0000 mg | ORAL_TABLET | Freq: Three times a day (TID) | ORAL | 0 refills | Status: DC | PRN

## 2017-02-09 MED ORDER — BUPIVACAINE HCL 0.5 % IJ SOLN
1.0000 mL | INTRAMUSCULAR | Status: AC | PRN
  Administered 2017-02-09: 1 mL via INTRA_ARTICULAR

## 2017-02-09 NOTE — Progress Notes (Signed)
Office Visit Note   [name]           Date of Birth: 09-15-1986           MRN: [id number] Visit Date: 02/09/2017              Requested by: Lizbeth Bark, FNP [address] [place], [place] [address] PCP: Arrie Senate, FNP   Assessment & Plan: Visit Diagnoses:  1. Osteoarthritis of right knee, unspecified osteoarthritis type   2. Arthralgia of right knee   3. Chronic pain of left knee     Plan: Overall clinical impression is difficult to discern. MRI shows a subtalar cyst but I don't feel like this can completely explain all the symptoms. I did try a injection into the sinus Tarsi today. Over this will give him some relief. I'll like to see him back in 4 weeks area and continue with tramadol and ibuprofen as needed. Cam boot when weightbearing. Light duty for 4 weeks. Follow-up with me in 4 weeks.  Follow-Up Instructions: Return in about 4 weeks (around 03/09/2017).   Orders:  No orders of the defined types were placed in this encounter.  Meds ordered this encounter  Medications  . traMADol (ULTRAM) 50 MG tablet    Sig: Take 1 tablet (50 mg total) by mouth every 8 (eight) hours as needed for severe pain.    Dispense:  30 tablet    Refill:  0  . ibuprofen (ADVIL,MOTRIN) 800 MG tablet    Sig: Take 1 tablet (800 mg total) by mouth every 8 (eight) hours as needed (Take with food.).    Dispense:  30 tablet    Refill:  0      Procedures: Medium Joint Inj Date/Time: 02/09/2017 5:19 PM Performed by: Tarry Kos Authorized by: Tarry Kos   Indications:  Pain Location:  Ankle Site:  R ankle Ultrasound Guided: No   Fluoroscopic Guidance: No   Medications:  1 mL lidocaine 1 %; 1 mL bupivacaine 0.5 %; 40 mg methylPREDNISolone acetate 40 MG/ML     Clinical Data: No additional findings.   Subjective: Chief Complaint  Patient presents with  . Right Ankle - Pain    Patient is is a Hispanic male who is here with his interpreter  comes in with right ankle pain. He has been seeing Dr. 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 He continues to have pain on the lateral and medial aspect of the ankle and the anterior aspect of the ankle that is worse with work and with activity. He has tried ankle brace with    Review of Systems  Constitutional: Negative.   All other systems reviewed and are negative.    Objective: Vital Signs: There were no vitals taken for this visit.  Physical Exam  Constitutional: He is oriented to person, place, and time. He appears well-developed and well-nourished.  HENT:  Head: Normocephalic and atraumatic.  Eyes: Pupils are equal, round, and reactive to light.  Neck: Neck supple.  Pulmonary/Chest: Effort normal.  Abdominal: Soft.  Musculoskeletal: Normal range of motion.  Neurological: He is alert and oriented to person, place, and time.  Skin: Skin is warm.  Psychiatric: He has a normal mood and affect. His behavior is normal. Judgment and thought content normal.  Nursing note and vitals reviewed.   Ortho Exam Right ankle exam shows tenderness of the tarsal tunnel of the sinus  Tarsi and the talonavicular joint. He endorses pain with ankle dorsiflexion. Specialty Comments:  No specialty comments available.  Imaging: No results found.   PMFS History: Patient Active Problem List   Diagnosis Date Noted  . Right leg pain 01/11/2017   No past medical history on file.  Family History  Problem Relation Age of Onset  . Hypertension Mother   . Cancer Brother     Past Surgical History:  Procedure Laterality Date  . APPENDECTOMY     Social History   Occupational History  . Not on file.   Social History Main Topics  . Smoking status: Never Smoker  . Smokeless tobacco: Never Used  . Alcohol use Yes     Comment: occ  . Drug use: Unknown  . Sexual activity: Not on file

## 2017-02-16 ENCOUNTER — Ambulatory Visit: Payer: Self-pay | Admitting: Sports Medicine

## 2017-02-16 ENCOUNTER — Ambulatory Visit: Payer: Self-pay | Admitting: Family Medicine

## 2017-02-19 MED FILL — traMADol HCL 50 MG TABS: 50 | 10 days supply | Qty: 30 | Fill #0

## 2017-02-19 MED FILL — IBUPROFEN 800 MG TABLET: 800 | 10 days supply | Qty: 30 | Fill #0

## 2017-03-08 ENCOUNTER — Ambulatory Visit: Payer: Self-pay | Admitting: Family Medicine

## 2017-03-09 ENCOUNTER — Ambulatory Visit (INDEPENDENT_AMBULATORY_CARE_PROVIDER_SITE_OTHER): Payer: Self-pay | Admitting: Orthopaedic Surgery

## 2017-03-09 ENCOUNTER — Encounter (INDEPENDENT_AMBULATORY_CARE_PROVIDER_SITE_OTHER): Payer: Self-pay | Admitting: Orthopaedic Surgery

## 2017-03-09 VITALS — Ht 70.0 in | Wt 220.0 lb

## 2017-03-09 DIAGNOSIS — M25571 Pain in right ankle and joints of right foot: Secondary | ICD-10-CM

## 2017-03-09 MED ORDER — TRAMADOL HCL 50 MG PO TABS: 50.0000 mg | ORAL_TABLET | Freq: Three times a day (TID) | ORAL | 0 refills | Status: DC | PRN

## 2017-03-09 MED ORDER — IBUPROFEN 800 MG PO TABS: 800.0000 mg | ORAL_TABLET | Freq: Three times a day (TID) | ORAL | 0 refills | Status: DC | PRN

## 2017-03-09 NOTE — Progress Notes (Signed)
Office Visit Note   [name]           Date of Birth: 03-12-1986           MRN: [id number] Visit Date: 03/09/2017              Requested by: Lizbeth Bark, FNP [address] [place], [place] [address] PCP: Lizbeth Bark, FNP   Assessment & Plan: Visit Diagnoses:  1. Arthralgia of right ankle     Plan: Overall impression is that he may have tarsal tunnel syndrome. I'm going to refer him to Dr. Lajoyce Corners for further evaluation and treatment. Total face to face encounter time was greater than 25 minutes and over half of this time was spent in counseling and/or coordination of care.   Follow-Up Instructions: Return if symptoms worsen or fail to improve.   Orders:  No orders of the defined types were placed in this encounter.  Meds ordered this encounter  Medications  . traMADol (ULTRAM) 50 MG tablet    Sig: Take 1 tablet (50 mg total) by mouth every 8 (eight) hours as needed for severe pain.    Dispense:  60 tablet    Refill:  0  . ibuprofen (ADVIL,MOTRIN) 800 MG tablet    Sig: Take 1 tablet (800 mg total) by mouth every 8 (eight) hours as needed (Take with food.).    Dispense:  60 tablet    Refill:  0      Procedures: No procedures performed   Clinical Data: No additional findings.   Subjective: Chief Complaint  Patient presents with  . Right Ankle - Follow-up    Patient comes back with continued ankle pain. Sinus Tarsi injection gave him 2 days of relief. He is now mainly having pain on the medial aspect of the ankle down into the heel. He denies any new injuries his interpreter is here with him today    Review of Systems  Constitutional: Negative.   All other systems reviewed and are negative.    Objective: Vital Signs: Ht 5\' 10"  (1.778 m)   Wt 220 lb (99.8 kg)   BMI 31.57 kg/m   Physical Exam  Constitutional: He is oriented to person, place, and time. He appears well-developed and well-nourished.  HENT:  Head:  Normocephalic and atraumatic.  Eyes: Pupils are equal, round, and reactive to light.  Neck: Neck supple.  Pulmonary/Chest: Effort normal.  Abdominal: Soft.  Musculoskeletal: Normal range of motion.  Neurological: He is alert and oriented to person, place, and time.  Skin: Skin is warm.  Psychiatric: He has a normal mood and affect. His behavior is normal. Judgment and thought content normal.  Nursing note and vitals reviewed.   Ortho Exam Ankle exam shows tenderness and positive Tinel's at the tarsal tunnel. Specialty Comments:  No specialty comments available.  Imaging: No results found.   PMFS History: Patient Active Problem List   Diagnosis Date Noted  . Arthralgia of right ankle 02/09/2017  . Right leg pain 01/11/2017   History reviewed. No pertinent past medical history.  Family History  Problem Relation Age of Onset  . Hypertension Mother   . Cancer Brother     Past Surgical History:  Procedure Laterality Date  . APPENDECTOMY     Social History   Occupational History  . Not on file.   Social History Main Topics  . Smoking status: Never Smoker  . Smokeless tobacco: Never Used  . Alcohol use Yes  Comment: occ  . Drug use: Unknown  . Sexual activity: Not on file

## 2017-03-18 ENCOUNTER — Ambulatory Visit (INDEPENDENT_AMBULATORY_CARE_PROVIDER_SITE_OTHER): Payer: Self-pay | Admitting: Orthopedic Surgery

## 2017-03-18 ENCOUNTER — Encounter (INDEPENDENT_AMBULATORY_CARE_PROVIDER_SITE_OTHER): Payer: Self-pay | Admitting: Orthopedic Surgery

## 2017-03-18 VITALS — Ht 70.0 in | Wt 220.0 lb

## 2017-03-18 DIAGNOSIS — M79671 Pain in right foot: Secondary | ICD-10-CM

## 2017-03-18 DIAGNOSIS — M1A0710 Idiopathic chronic gout, right ankle and foot, without tophus (tophi): Secondary | ICD-10-CM

## 2017-03-18 MED ORDER — LIDOCAINE HCL 1 % IJ SOLN
1.0000 mL | INTRAMUSCULAR | Status: AC | PRN
  Administered 2017-03-18: 1 mL

## 2017-03-18 MED ORDER — METHYLPREDNISOLONE ACETATE 40 MG/ML IJ SUSP
40.0000 mg | INTRAMUSCULAR | Status: AC | PRN
  Administered 2017-03-18: 40 mg via INTRA_ARTICULAR

## 2017-03-18 NOTE — Progress Notes (Signed)
Office Visit Note   [name]           Date of Birth: 10/19/1986           MRN: [id number] Visit Date: 03/18/2017              Requested by: Lizbeth Bark, FNP [address] [place], [place] [address] PCP: Lizbeth Bark, FNP  Chief Complaint  Patient presents with  . Right Ankle - Pain      HPI: Patient presents for evaluation for right foot and ankle pain. Patient was initially seen at sports medicine [facility]. he was referred to Dr. Roda Shutters MRI scan shows a cyst in the subtalar joint possibly impinging on the tarsal tunnel lengths patient is seen for evaluation and treatment. Patient states he has tried tramadol and ibuprofen without relief. Patient states he has pain radiating from the foot up to the hip. He denies any lower back pain. Patient states he currently works in [employer] and is pain with his activities of daily living. 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 Patient states that he has a not develop on the plantar aspect of his foot.  Assessment & Plan: Visit Diagnoses:  1. Idiopathic chronic gout of right foot without tophus   2. Foot pain, right     Plan: The subtalar joint was injected through the sinus Tarsi. Patient tolerated this well we will draw a uric acid level to rule out gout. Reevaluate in 2 weeks. Discussed that narcotic pain medicine would not be a treatment for the subtalar cyst or for gout. Patient was seen with an independent interpreter present.  Follow-Up Instructions: Return in about 2 weeks (around 04/01/2017).   Ortho Exam  Patient is alert, oriented, no adenopathy, well-dressed, normal affect, normal respiratory effort. On examination patient does have an antalgic gait. He has a good dorsalis pedis pulse he has good ankle and subtalar motion. He is tender to palpation over the anterior lateral joint line of the ankle as  well as tender to palpation of the sinus Tarsi in the subtalar joint. Patient does not have tenderness to palpation along the posterior tibial tendon and does not have pain at the insertion. He does have tenderness to palpation along the plantar fascia as well as medially over the ankle. The tarsal tunnel is nontender to palpation. Patient's MRI scan is reviewed which shows a small amount of cystic fluid medially coming from the subtalar joint. There is no bony changes no tendon changes no evidence of any tendon disruption. The cyst does not impinge on the tendons.  Imaging: No results found.  Labs: Lab Results  Component Value Date   HGBA1C 5.5 09/07/2014    Orders:  No orders of the defined types were placed in this encounter.  No orders of the defined types were placed in this encounter.    Procedures: Small Joint Inj Date/Time: 03/18/2017 4:30 PM Performed by: DUDA, MARCUS V Authorized by: Nadara Mustard   Consent Given by:  Patient Site marked: the procedure site was marked   Timeout: prior to procedure the correct patient, procedure, and site was verified   Indications:  Pain and diagnostic evaluation Location:  Foot Site:  R subtalar Prep: patient was prepped and draped in usual sterile fashion   Needle Size:  22 G Spinal Needle: No   Approach:  Dorsal  Ultrasound Guided: No   Fluoroscopic Guidance: No   Medications:  1 mL lidocaine 1 %; 40 mg methylPREDNISolone acetate 40 MG/ML Aspiration Attempted: No   Patient tolerance:  Patient tolerated the procedure well with no immediate complications    Clinical Data: No additional findings.  ROS:  All other systems negative, except as noted in the HPI. Review of Systems  Objective: Vital Signs: Ht 5\' 10"  (1.778 m)   Wt 220 lb (99.8 kg)   BMI 31.57 kg/m   Specialty Comments:  No specialty comments available.  PMFS History: Patient Active Problem List   Diagnosis Date Noted  . Arthralgia of right ankle  02/09/2017  . Right leg pain 01/11/2017   No past medical history on file.  Family History  Problem Relation Age of Onset  . Hypertension Mother   . Cancer Brother     Past Surgical History:  Procedure Laterality Date  . APPENDECTOMY     Social History   Occupational History  . Not on file.   Social History Main Topics  . Smoking status: Never Smoker  . Smokeless tobacco: Never Used  . Alcohol use Yes     Comment: occ  . Drug use: Unknown  . Sexual activity: Not on file

## 2017-03-18 NOTE — Addendum Note (Signed)
Addended by: Rodena MedinFORREST, AUTUMN L on: 03/18/2017 04:39 PM   Modules accepted: Orders

## 2017-03-19 ENCOUNTER — Other Ambulatory Visit (INDEPENDENT_AMBULATORY_CARE_PROVIDER_SITE_OTHER): Payer: Self-pay | Admitting: Family

## 2017-03-19 LAB — URIC ACID: URIC ACID, SERUM: 10.1 mg/dL — AB (ref 4.0–8.0)

## 2017-03-19 MED ORDER — COLCHICINE 0.6 MG PO TABS: 0.6000 mg | ORAL_TABLET | Freq: Every day | ORAL | 12 refills | Status: DC

## 2017-03-19 MED ORDER — ALLOPURINOL 100 MG PO TABS: 100.0000 mg | ORAL_TABLET | Freq: Two times a day (BID) | ORAL | 3 refills | Status: DC

## 2017-03-19 MED FILL — ALLOPURINOL 100 MG TABLET: 100 | 30 days supply | Qty: 60 | Fill #0

## 2017-03-19 MED FILL — COLCHICINE 0.6 MG TABLET: 0.6 | 30 days supply | Qty: 30 | Fill #0

## 2017-03-19 NOTE — Addendum Note (Signed)
Addended by: Barnie DelZAMORA, ERIN R on: 03/19/2017 09:57 AM   Modules accepted: Orders

## 2017-03-19 NOTE — Progress Notes (Signed)
Will you call and advise has gout, will call in allopurinol, colchicine, does he have follow up?

## 2017-03-22 MED FILL — traMADol HCL 50 MG TABS: 50 | 20 days supply | Qty: 60 | Fill #0

## 2017-03-22 MED FILL — IBUPROFEN 800 MG TABLET: 800 | 20 days supply | Qty: 60 | Fill #0

## 2017-04-01 ENCOUNTER — Ambulatory Visit (INDEPENDENT_AMBULATORY_CARE_PROVIDER_SITE_OTHER): Payer: Self-pay | Admitting: Orthopedic Surgery

## 2017-04-01 DIAGNOSIS — M1A0710 Idiopathic chronic gout, right ankle and foot, without tophus (tophi): Secondary | ICD-10-CM

## 2017-04-01 NOTE — Patient Instructions (Signed)
Dieta con bajo contenido de purinas (Low-Purine Diet) Las purinas son compuestos que influyen en el nivel de cido rico en el cuerpo. Una dieta con bajo contenido de purinas es aquella que tiene un bajo nivel de [place]. El consumo de una dieta con bajo contenido de purinas puede evitar que el nivel de cido rico del cuerpo se eleve demasiado y cause gota o clculos renales, o ambas cosas. QU DEBO SABER ACERCA DE ESTA Rule?  Elija alimentos con bajo contenido de purinas. En la siguiente seccin, se incluyen ejemplos de alimentos con bajo contenido de purinas.  [employer] lquido, Pension scheme manager. El lquido puede ayudar a Tour manager cido rico del cuerpo. Intente tomar de 8a 16vasos (1,9l a 3,8l) por da.  Limite el consumo de alimentos con alto contenido de grasa, en especial de grasas saturadas, ya que la grasa hace que el cuerpo tenga ms dificultad para eliminar el cido rico. Los alimentos con alto contenido de grasas saturadas incluyen pizza, queso, helado, [place] entera, alimentos fritos y salsas. Elija alimentos con bajo contenido de [place] y fuentes de protenas magras. Al cocinar, elija aceite de oliva, ya que contiene grasas saludables con bajo contenido de grasas saturadas.  Limite el consumo de bebidas alcohlicas. El alcohol interfiere en la eliminacin de cido rico del cuerpo. Si est teniendo [place] crisis de gota, evite consumir alcohol.  Tenga en cuenta que el cuerpo de cada persona reacciona diferente a los distintos alimentos. Con el tiempo, es probable que reconozca los alimentos que lo afectan o no. Si descubre que un alimento tiende a causarle una exacerbacin de la gota, evite comerlo. Puede disfrutar libremente de los alimentos que no le causan problemas. Si tiene [place] pregunta sobre un alimento especfico, hable con el nutricionista o el mdico.  QU [place], [place]? La siguiente lista incluye alimentos con  contenido bajo, moderado y alto de purinas. Puede comer la cantidad que desee de los alimentos con bajo contenido de purinas. Tambin puede consumir bajas cantidades de alimentos con contenido moderado de purinas. Consulte al mdico la cantidad de alimentos con contenido moderado de purinas que puede consumir. Evite los alimentos con alto contenido de purinas. Cereales  Alimentos con bajo contenido de purinas: Pan con harina blanca enriquecida, pastas, arroz, pasteles, pan de maz, palomitas de maz.  Alimentos con contenido moderado de purinas: Actor y panes integrales, germen de trigo, salvado, avena. Avena no cocida. Germen de trigo o salvado de trigo deshidratados.  Alimentos con alto contenido de purinas: Panqueques, tostadas francesas, bizcochos, bollitos. Vegetales  Alimentos con bajo contenido de purinas: Todas las verduras, excepto las que tienen contenido moderado de purinas.  Alimentos con contenido moderado de purinas: Esprragos, coliflor, espinaca, championes, guisantes. Frutas  Todas las frutas tienen bajo contenido de purinas. Carnes y otras fuentes de protenas.  Alimentos con bajo contenido de purinas: Huevos, frutos secos, [place] de man.  Alimentos con [employer] de purinas: Carne de vaca 80% o 90% magra, cordero, ternera, cerdo, aves, pescado, [place], [place] de man, frutos secos. Cangrejo, [place], ostras y camarones. Lentejas, frijoles y guisantes secos y cocidos.  Alimentos con [employer] de purinas: Anchoas, sardinas, arenque, mejillones, atn, bacalao, vieiras, trucha y abadejo. Panceta. Vsceras (como hgado o riones). [place]. Carne de caza. Ganso. Mollejas. Lcteos  [employer] tienen bajo contenido de purinas. Los lcteos sin grasa o con bajo contenido de grasa son mejores, porque tienen pocas grasas saturadas. Bebidas  Bebidas con bajo contenido  de purinas: Agua, bebidas gaseosas, t, caf, cacao.  Bebidas con contenido  moderado de purinas: Gaseosas y otras bebidas endulzadas con jarabe de maz con alto contenido de fructosa. Jugos. Para determinar si una bebida o un alimento estn endulzados con jarabe de maz con alto contenido de fructosa, fjese en la lista de ingredientes.  Bebidas con alto contenido de purinas: Bebidas alcohlicas (por ejemplo, cerveza). Condimentos  Alimentos con bajo contenido de purinas: Sal, hierbas, aceitunas, pickles, salsas, vinagre.  Alimentos con contenido moderado de purinas: [place], margarina, aceites, mayonesa. Grasas y aceites  Alimentos con bajo contenido de purinas: Todos los tipos, excepto las salsas preparadas con carne.  Alimentos con alto contenido de purinas: Salsas preparadas con carne. Otros alimentos  Alimentos con bajo contenido de purinas: Azcares, dulces, gelatina. Pasteles. Sopas elaboradas sin carne.  Alimentos con contenido moderado de purinas: Sopas o caldos a base de carne o de pescado. Bebidas y alimentos endulzados con jarabe de maz con alto contenido de fructosa.  Alimentos con alto contenido de purinas: Postres con alto contenido de grasa (por ejemplo, helado, galletas, pasteles, tartas, donas y chocolate). Comunquese con el nutricionista para obtener ms informacin sobre los alimentos que no estn incluidos. Esta informacin no tiene como fin reemplazar el consejo del mdico. Asegrese de hacerle al mdico cualquier pregunta que tenga. Document Released: 06/29/2012 Document Revised: 10/10/2013 Document Reviewed: 09/11/2013 Elsevier Interactive Patient Education  2017 Elsevier Inc.  

## 2017-04-01 NOTE — Progress Notes (Signed)
   Office Visit Note   [name]           Date of Birth: 03-27-1986           MRN: [id number] Visit Date: 04/01/2017              Requested by: Lizbeth BarkHairston, Mandesia R, FNP [address] [place], [place] [id number] PCP: Lizbeth BarkHairston, Mandesia R, FNP  Chief Complaint  Patient presents with  . Right Foot - Follow-up  . Foot Pain    Rt foot still get sore and not getting any better.      HPI: Patient presents for follow-up for acute gout of the great toe and subtalar joint right foot. His uric acid was 10.1 and we started him on allopurinol 100 mg twice a day and colchicine daily. Patient states he has been taking these medicines but may miss some of the colchicine tablets. He states he still has pain in the subtalar joint still has pain in the great toe he states his subtalar injection provided little relief.  Assessment & Plan: Visit Diagnoses:  1. Idiopathic chronic gout of right foot without tophus     Plan: Recommend continuing the allopurinol 100 mg twice a day colchicine 0.6 mg once a day follow-up in 2 weeks with repeat blood draw for uric acid. Discussed that if we are not showing significant improvement with these medications we may need to consider U Lorick. Patient was given a list of foods to avoid in both [place] and [place].  Follow-Up Instructions: Return in about 2 weeks (around 04/15/2017).   Ortho Exam  Patient is alert, oriented, no adenopathy, well-dressed, normal affect, normal respiratory effort. Examination patient does have an antalgic gait he has no redness no cellulitis. He is tender to palpation over the great toe MTP joint as well as tender to palpation in the sinus Tarsi. There is no ascending cellulitis no signs of infection. There are no tophaceous changes.  Imaging: No results found.  Labs: Lab Results  Component Value Date   HGBA1C 5.5 09/07/2014   LABURIC 10.1 (H) 03/18/2017    Orders:  No orders of the defined types were  placed in this encounter.  No orders of the defined types were placed in this encounter.    Procedures: No procedures performed  Clinical Data: No additional findings.  ROS:  All other systems negative, except as noted in the HPI. Review of Systems  Objective: Vital Signs: There were no vitals taken for this visit.  Specialty Comments:  No specialty comments available.  PMFS History: Patient Active Problem List   Diagnosis Date Noted  . Arthralgia of right ankle 02/09/2017  . Right leg pain 01/11/2017   No past medical history on file.  Family History  Problem Relation Age of Onset  . Hypertension Mother   . Cancer Brother     Past Surgical History:  Procedure Laterality Date  . APPENDECTOMY     Social History   Occupational History  . Not on file.   Social History Main Topics  . Smoking status: Never Smoker  . Smokeless tobacco: Never Used  . Alcohol use Yes     Comment: occ  . Drug use: Unknown  . Sexual activity: Not on file

## 2017-04-08 ENCOUNTER — Telehealth (INDEPENDENT_AMBULATORY_CARE_PROVIDER_SITE_OTHER): Payer: Self-pay | Admitting: Family

## 2017-04-08 NOTE — Telephone Encounter (Signed)
PT NEEDS REFILL ON MEDS PLEASE.  [phone]

## 2017-04-08 NOTE — Telephone Encounter (Signed)
Left voicemail for patient advised that if he does not have allopurinol or colchine the gout medications we would refill this but he should have been written a month supply two weeks prior. If this is for pain medication Dr. Lajoyce Cornersuda declines narcotics inappropriate for gout diagnosis. Needs to take gout medication and change his diet. Again this was spoken in length with patient at last office visit by Dr. Lajoyce Cornersuda.

## 2017-04-08 NOTE — Telephone Encounter (Signed)
Inappropriate. Patient needs to be taking gout medication. Uric acid was 10, was advised by Dr. Lajoyce Cornersuda that narcotics would not be appropriate treatment he was counseled in length with interpreter about gout and what foods to avoid.

## 2017-04-13 ENCOUNTER — Telehealth (INDEPENDENT_AMBULATORY_CARE_PROVIDER_SITE_OTHER): Payer: Self-pay | Admitting: Radiology

## 2017-04-13 MED FILL — COLCHICINE 0.6 MG TABLET: 0.6 | 30 days supply | Qty: 30 | Fill #1

## 2017-04-13 MED FILL — ALLOPURINOL 100 MG TABLET: 100 | 30 days supply | Qty: 60 | Fill #1

## 2017-04-13 NOTE — Telephone Encounter (Signed)
I tried to call pt and his wife answered phone was in the lobby. The pt has been taking colcrys and allopurinol and is still having ankle pain that is so painful that she says he is having trouble at work. After much discussion advised that we can give another cortisone injection ( last one was 03/18/17 and did help) and we can recheck labs to see if the uric acid level has come down. Voiced understanding and will come in tomorrow for appt.

## 2017-04-13 NOTE — Telephone Encounter (Signed)
Patient's wife called, and asked about refills of medication.  I advised her the pharmacy has refills, and I called the pharmacy to ask that they get this ready for the patient.  Wife also asks for a Rx tramadol be refilled for patient.  Please call her to advise.

## 2017-04-14 ENCOUNTER — Encounter (INDEPENDENT_AMBULATORY_CARE_PROVIDER_SITE_OTHER): Payer: Self-pay | Admitting: Orthopedic Surgery

## 2017-04-14 ENCOUNTER — Ambulatory Visit (INDEPENDENT_AMBULATORY_CARE_PROVIDER_SITE_OTHER): Payer: Self-pay | Admitting: Orthopedic Surgery

## 2017-04-14 VITALS — Ht 70.0 in | Wt 220.0 lb

## 2017-04-14 DIAGNOSIS — M79671 Pain in right foot: Secondary | ICD-10-CM

## 2017-04-14 DIAGNOSIS — M1A0710 Idiopathic chronic gout, right ankle and foot, without tophus (tophi): Secondary | ICD-10-CM

## 2017-04-14 DIAGNOSIS — M25571 Pain in right ankle and joints of right foot: Secondary | ICD-10-CM

## 2017-04-14 DIAGNOSIS — M1A00X0 Idiopathic chronic gout, unspecified site, without tophus (tophi): Secondary | ICD-10-CM | POA: Insufficient documentation

## 2017-04-14 MED ORDER — LIDOCAINE HCL 1 % IJ SOLN
2.0000 mL | INTRAMUSCULAR | Status: AC | PRN
  Administered 2017-04-14: 2 mL

## 2017-04-14 MED ORDER — METHYLPREDNISOLONE ACETATE 40 MG/ML IJ SUSP
40.0000 mg | INTRAMUSCULAR | Status: AC | PRN
  Administered 2017-04-14: 40 mg via INTRA_ARTICULAR

## 2017-04-14 MED ORDER — FEBUXOSTAT 40 MG PO TABS: 80.0000 mg | ORAL_TABLET | Freq: Every day | ORAL | 3 refills | Status: DC

## 2017-04-14 MED ORDER — FEBUXOSTAT 40 MG PO TABS: 40.0000 mg | ORAL_TABLET | Freq: Every day | ORAL | 3 refills | Status: DC

## 2017-04-14 MED ORDER — TRAMADOL HCL 50 MG PO TABS: 50.0000 mg | ORAL_TABLET | Freq: Three times a day (TID) | ORAL | 0 refills | Status: DC | PRN

## 2017-04-14 MED FILL — ULORIC 40 MG TABLET: 40 | 30 days supply | Qty: 30 | Fill #0

## 2017-04-14 NOTE — Progress Notes (Signed)
Office Visit Note   [name]           Date of Birth: 26-Jan-1986           MRN: [id number] Visit Date: 04/14/2017              Requested by: Lizbeth Bark, FNP [address] [place], [place] [address] PCP: Lizbeth Bark, FNP  Chief Complaint  Patient presents with  . Right Foot - Pain    Gout with a uric acid of 10.1 03/18/17      HPI: Patient presents for follow-up for gout of the great toe and subtalar joint right foot. His uric acid was last 10.1. Has been on allopurinol 100 mg twice a day and colchicine daily for a month now with no improvement. Patient reports good adherence. He states he still has pain in the subtalar joint. still has pain in the great toe he states his subtalar injection provided relief for a week or so. Requests repeat injection today for relief.  Assessment & Plan: Visit Diagnoses:  1. Pain in right foot   2. Arthralgia of right ankle   3. Idiopathic chronic gout of right ankle without tophus     Plan: Recommend switching to Uloric. Have refilled his Tramadol. Repeat cortisone injection today as well. Follow up in 4 weeks.   Follow-Up Instructions: Return in about 4 weeks (around 05/12/2017).   Ortho Exam  Patient is alert, oriented, no adenopathy, well-dressed, normal affect, normal respiratory effort. Examination patient does have an antalgic gait he has no redness no cellulitis. He is tender to palpation over the great toe MTP joint as well as tender to palpation in the sinus Tarsi. There is no ascending cellulitis no signs of infection. There are no tophaceous changes.  Imaging: No results found.  Labs: Lab Results  Component Value Date   HGBA1C 5.5 09/07/2014   LABURIC 10.1 (H) 03/18/2017    Orders:  Orders Placed This Encounter  Procedures  . Uric acid   Meds ordered this encounter  Medications  . traMADol (ULTRAM) 50 MG tablet    Sig: Take 1 tablet (50 mg total) by mouth every 8 (eight)  hours as needed for severe pain.    Dispense:  60 tablet    Refill:  0  . DISCONTD: febuxostat (ULORIC) 40 MG tablet    Sig: Take 1 tablet (40 mg total) by mouth daily.    Dispense:  30 tablet    Refill:  3  . febuxostat (ULORIC) 40 MG tablet    Sig: Take 2 tablets (80 mg total) by mouth daily.    Dispense:  30 tablet    Refill:  3     Procedures: Medium Joint Inj Date/Time: 04/14/2017 4:23 PM Performed by: Barnie Del R Authorized by: Barnie Del R   Consent Given by:  Patient Site marked: the procedure site was marked   Timeout: prior to procedure the correct patient, procedure, and site was verified   Indications:  Pain and diagnostic evaluation Location:  Ankle Site:  R ankle Prep: patient was prepped and draped in usual sterile fashion   Needle Size:  22 G Needle Length:  1.5 inches Ultrasound Guided: No   Fluoroscopic Guidance: No   Medications:  2 mL lidocaine 1 %; 40 mg methylPREDNISolone acetate 40 MG/ML Aspiration Attempted: No   Patient tolerance:  Patient tolerated the procedure well with no immediate complications    Clinical Data: No additional findings.  ROS:  All other systems negative, except as noted in the HPI. Review of Systems  Constitutional: Negative for chills and fever.  Musculoskeletal: Positive for arthralgias. Negative for joint swelling.    Objective: Vital Signs: Ht 5\' 10"  (1.778 m)   Wt 220 lb (99.8 kg)   BMI 31.57 kg/m   Specialty Comments:  No specialty comments available.  PMFS History: Patient Active Problem List   Diagnosis Date Noted  . Idiopathic chronic gout, unspecified site, without tophus (tophi) 04/14/2017  . Arthralgia of right ankle 02/09/2017  . Right leg pain 01/11/2017   No past medical history on file.  Family History  Problem Relation Age of Onset  . Hypertension Mother   . Cancer Brother     Past Surgical History:  Procedure Laterality Date  . APPENDECTOMY     Social History   Occupational  History  . Not on file.   Social History Main Topics  . Smoking status: Never Smoker  . Smokeless tobacco: Never Used  . Alcohol use Yes     Comment: occ  . Drug use: Unknown  . Sexual activity: Not on file

## 2017-04-15 LAB — URIC ACID: URIC ACID, SERUM: 3.6 mg/dL — AB (ref 4.0–8.0)

## 2017-04-15 MED FILL — traMADol HCL 50 MG TABS: 50 | 20 days supply | Qty: 60 | Fill #0

## 2017-04-16 ENCOUNTER — Telehealth (INDEPENDENT_AMBULATORY_CARE_PROVIDER_SITE_OTHER): Payer: Self-pay

## 2017-04-16 ENCOUNTER — Ambulatory Visit (INDEPENDENT_AMBULATORY_CARE_PROVIDER_SITE_OTHER): Payer: Self-pay | Admitting: Orthopedic Surgery

## 2017-04-16 NOTE — Telephone Encounter (Addendum)
Daniel Klein with [facility] pharmacy would like to know if Rx for Uloric can be changed to Colcrys or Allopurinol.  Pharmacy doesn't carry Uloric.  Cb# is [phone].

## 2017-04-20 NOTE — Telephone Encounter (Signed)
Pharmacy is asking if the pt can continue on the allopurinol and colcrys because they do not have uloric in their pharmacy. The pt had a decrease in uric acid from 10.1 to a 3.6 and so the medication did work to bring down his uric acid in one month. The pt had come back into the office complaining of continued pain and that the medication was not working but by the labs it shows that it has. Please advise.

## 2017-04-20 NOTE — Telephone Encounter (Signed)
I called and lm on vm to advise that it is ok for the pt to have allopurinol and colcrys filled if this is what they carry. He has been on it and it has worked to lower his uric acid and it is ok to fill this medication for him vs the uloric which they do not carry.

## 2017-04-20 NOTE — Telephone Encounter (Signed)
OK call pharmacy and patient may use allopurinol 100 mg twice a day for gout. Only use colchicine for breakthrough pain. Let the patient know that the destructive changes of the gout will take some time for the symptoms to decrease.

## 2017-05-14 ENCOUNTER — Ambulatory Visit (INDEPENDENT_AMBULATORY_CARE_PROVIDER_SITE_OTHER): Payer: Self-pay | Admitting: Family

## 2017-05-14 ENCOUNTER — Ambulatory Visit (INDEPENDENT_AMBULATORY_CARE_PROVIDER_SITE_OTHER): Payer: Self-pay | Admitting: Orthopedic Surgery

## 2017-05-14 ENCOUNTER — Encounter (INDEPENDENT_AMBULATORY_CARE_PROVIDER_SITE_OTHER): Payer: Self-pay | Admitting: Family

## 2017-05-14 VITALS — Ht 70.0 in | Wt 220.0 lb

## 2017-05-14 DIAGNOSIS — M25571 Pain in right ankle and joints of right foot: Secondary | ICD-10-CM

## 2017-05-14 DIAGNOSIS — M1A0710 Idiopathic chronic gout, right ankle and foot, without tophus (tophi): Secondary | ICD-10-CM

## 2017-05-14 MED ORDER — TRAMADOL HCL 50 MG PO TABS: 50.0000 mg | ORAL_TABLET | Freq: Two times a day (BID) | ORAL | 0 refills | Status: DC

## 2017-05-14 MED ORDER — FEBUXOSTAT 40 MG PO TABS: 80.0000 mg | ORAL_TABLET | Freq: Every day | ORAL | 3 refills | Status: DC

## 2017-05-14 MED ORDER — LIDOCAINE HCL 1 % IJ SOLN
2.0000 mL | INTRAMUSCULAR | Status: AC | PRN
Start: 2017-05-14 — End: 2017-05-14
  Administered 2017-05-14: 2 mL

## 2017-05-14 MED ORDER — METHYLPREDNISOLONE ACETATE 40 MG/ML IJ SUSP
40.0000 mg | INTRAMUSCULAR | Status: AC | PRN
  Administered 2017-05-14: 40 mg via INTRA_ARTICULAR

## 2017-05-14 MED FILL — ULORIC 40 MG TABLET: 40 | 30 days supply | Qty: 30 | Fill #1

## 2017-05-14 MED FILL — ALLOPURINOL 100 MG TABLET: 100 | 30 days supply | Qty: 60 | Fill #2

## 2017-05-14 MED FILL — traMADol HCL 50 MG TABS: 50 | 30 days supply | Qty: 60 | Fill #0

## 2017-05-14 MED FILL — COLCHICINE 0.6 MG TABLET: 0.6 | 30 days supply | Qty: 30 | Fill #2

## 2017-05-14 NOTE — Progress Notes (Signed)
Office Visit Note   [name]           Date of Birth: Feb 28, 1986           MRN: [id number] Visit Date: 05/14/2017              Requested by: Lizbeth BarkHairston, Mandesia R, FNP [address] [place], [place] [address] PCP: Lizbeth BarkHairston, Mandesia R, FNP  Chief Complaint  Patient presents with  . Right Foot - Follow-up    Gout- uric acid 3.6 on 04/14/17      HPI: The patient is a 31 year old gentleman who presents today complaining of continued right ankle pain. Points to the medial side as most painful area having pain that radiates under the plantar medial aspect of his foot. He was initially referred from Dr. Deno Etiennehu for concern of tarsal tunnel. With Dr. Audrie Liauda's initial visit his uric acid was found to be 10.1 his gout is now well controlled with U [place]ick and his uric acid is down to 3.6. Feels minimal improvement in his ankle and foot pain.   Has taken tramadol in the past with moderate relief of pain. Has tried anti-inflammatories without relief. States he did double up on his gout medications in an attempt to improve his pain.  Has had interval relief with prior cortisone injections. Request repeat today.  Assessment & Plan: Visit Diagnoses:  1. Arthralgia of right ankle   2. Idiopathic chronic gout of right ankle without tophus     Plan:  reeducated as to take only the prescribed dosage of his Uloric and colchicine.  Gout well controlled and having continued pain, burning and numbness. Injection today. [facility], concern for tarsal tunnel on right. To see Lajoyce CornersDuda back after EMGs.  Continue uloric and colchicine. Provided tramadol refill.  Follow-Up Instructions: Return for f/u post EMG.   Right Ankle Exam  Swelling: mild  Tenderness  Right ankle tenderness location: sinus tarsi, anterior joint line.    Range of Motion  The patient has normal right ankle ROM.  Tests  Anterior drawer: negative Varus tilt: positive  Other  Erythema:  absent Pulse: present   Comments:  + tinels test      Patient is alert, oriented, no adenopathy, well-dressed, normal affect, normal respiratory effort.  Imaging: No results found.  Labs: Lab Results  Component Value Date   HGBA1C 5.5 09/07/2014   LABURIC 3.6 (L) 04/14/2017   LABURIC 10.1 (H) 03/18/2017    Orders:  Orders Placed This Encounter  Procedures  . Ambulatory referral to Physical Medicine Rehab   Meds ordered this encounter  Medications  . traMADol (ULTRAM) 50 MG tablet    Sig: Take 1 tablet (50 mg total) by mouth 2 (two) times daily.    Dispense:  60 tablet    Refill:  0     Procedures: Medium Joint Inj Date/Time: 05/14/2017 11:08 AM Performed by: Barnie DelZAMORA, ERIN R Authorized by: Barnie DelZAMORA, ERIN R   Consent Given by:  Patient Site marked: the procedure site was marked   Timeout: prior to procedure the correct patient, procedure, and site was verified   Indications:  Pain and diagnostic evaluation Location:  Ankle Site:  R ankle Prep: patient was prepped and draped in usual sterile fashion   Needle Size:  22 G Needle Length:  1.5 inches Ultrasound Guided: No   Fluoroscopic Guidance: No   Medications:  2 mL lidocaine 1 %; 40 mg methylPREDNISolone acetate 40 MG/ML Aspiration Attempted: No  Patient tolerance:  Patient tolerated the procedure well with no immediate complications    Clinical Data: No additional findings.  ROS:  All other systems negative, except as noted in the HPI. Review of Systems  Constitutional: Negative for chills and fever.  Musculoskeletal: Positive for arthralgias. Negative for joint swelling.  Neurological: Positive for numbness. Negative for weakness.    Objective: Vital Signs: Ht 5\' 10"  (1.778 m)   Wt 220 lb (99.8 kg)   BMI 31.57 kg/m   Specialty Comments:  No specialty comments available.  PMFS History: Patient Active Problem List   Diagnosis Date Noted  . Idiopathic chronic gout, unspecified site, without  tophus (tophi) 04/14/2017  . Arthralgia of right ankle 02/09/2017  . Right leg pain 01/11/2017   No past medical history on file.  Family History  Problem Relation Age of Onset  . Hypertension Mother   . Cancer Brother     Past Surgical History:  Procedure Laterality Date  . APPENDECTOMY     Social History   Occupational History  . Not on file.   Social History Main Topics  . Smoking status: Never Smoker  . Smokeless tobacco: Never Used  . Alcohol use Yes     Comment: occ  . Drug use: Unknown  . Sexual activity: Not on file

## 2017-06-08 ENCOUNTER — Encounter (INDEPENDENT_AMBULATORY_CARE_PROVIDER_SITE_OTHER): Payer: Self-pay | Admitting: Physical Medicine and Rehabilitation

## 2017-06-08 ENCOUNTER — Ambulatory Visit (INDEPENDENT_AMBULATORY_CARE_PROVIDER_SITE_OTHER): Payer: Self-pay | Admitting: Physical Medicine and Rehabilitation

## 2017-06-08 DIAGNOSIS — M79671 Pain in right foot: Secondary | ICD-10-CM

## 2017-06-08 DIAGNOSIS — R202 Paresthesia of skin: Secondary | ICD-10-CM

## 2017-06-08 NOTE — Progress Notes (Signed)
Daniel Klein - 31 y.o. male MRN [id number]  Date of birth: [birth year]  Office Visit Note: Visit Date: 06/08/2017 PCP: Lizbeth Bark, FNP Referred by: Thomas Hoff*  Subjective: Chief Complaint  Patient presents with  . Right Foot - Pain   HPI: [name] is a 31 year old gentleman who began seeing Dr. Margaretha Sheffield at [facility] with a five-month history of right lower leg and lateral ankle and foot pain. At the time he had 5 months history and this was in March. 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 He was then treated with medications and some therapy and bracing. He didn't saw Dr. Roda Shutters in our office who completed a sinus Tarsi injection without much relief. He then began seeing Dr. Lajoyce Corners and Barnie Del. His history is that 5 months prior to the March visit he reports pushing some cement with his foot according to Dr. Margaretha Sheffield and there was some pain from the leg down to the lateral ankle and knees had this pain ever since. He denies any left-sided complaints. He says his entire right foot hurts. He denies numbness now but did have numbness initially. He does not notice specific injury. He denies any weakness of the foot.    ROS Otherwise per HPI.  Assessment & Plan: Visit Diagnoses:  1. Paresthesia of skin   2. Pain in right foot     Plan: No additional findings.  Impression: The above electrodiagnostic study is ABNORMAL and reveals evidence of moderate chronic L5 radiculopathy on the right.  There is no significant electrodiagnostic evidence of any other focal nerve entrapment, lumbosacral plexopathy or generalized peripheral neuropathy.   Recommendations: 1.  Follow-up with referring physician. 2.  Continue current management of symptoms. 3.  Suggest MRI of the lumbar spine to look for source of L5 radiculopathy.   Meds & Orders: No orders of the defined  types were placed in this encounter.   Orders Placed This Encounter  Procedures  . NCV with EMG (electromyography)    Follow-up: Return for  Barnie Del, FN-P.   Procedures: No procedures performed  EMG & NCV Findings: Evaluation of the right sural sensory nerve showed reduced amplitude (3.0 V).  All remaining nerves (as indicated in the following tables) were within normal limits.    Needle evaluation of the right anterior tibialis and the right Fibularis Longus muscles showed increased insertional activity, increased spontaneous activity, and diminished recruitment.  All remaining muscles (as indicated in the following table) showed no evidence of electrical instability.    Impression: The above electrodiagnostic study is ABNORMAL and reveals evidence of moderate chronic L5 radiculopathy on the right.  There is no significant electrodiagnostic evidence of any other focal nerve entrapment, lumbosacral plexopathy or generalized peripheral neuropathy.   Recommendations: 1.  Follow-up with referring physician. 2.  Continue current management of symptoms. 3.  Suggest MRI of the lumbar spine to look for source of L5 radiculopathy.   Nerve Conduction Studies Anti Sensory Summary Table   Stim Site NR Peak (ms) Norm Peak (ms) P-T Amp (V) Norm P-T Amp Site1 Site2 Delta-P (ms) Dist (cm) Vel (m/s) Norm Vel (m/s)  Right Saphenous Anti Sensory (Ant Med Mall)  30.7C  14cm    4.1 <4.4 7.4 >2 14cm Ant Med Mall 4.1 0.0  >32  Right Sup Fibular Anti Sensory (Ant Lat Mall)  30.7C  14 cm  3.6 <4.4 16.1 >5.0 14 cm Ant Lat Mall 3.6 14.0 39 >32  Right Sural Anti Sensory (Lat Mall)  30.8C  Calf    3.7 <4.0 *3.0 >5.0 Calf Lat Mall 3.7 14.0 38 >35   Motor Summary Table   Stim Site NR Onset (ms) Norm Onset (ms) O-P Amp (mV) Norm O-P Amp Site1 Site2 Delta-0 (ms) Dist (cm) Vel (m/s) Norm Vel (m/s)  Right Fibular Motor (Ext Dig Brev)  31C  Ankle    3.6 <6.1 4.0 >2.5 B Fib Ankle 7.1 34.0 48 >38  B  Fib    10.7  3.5  Poplt B Fib 1.7 9.0 53 >40  Poplt    12.4  3.4         Right Tibial Motor (Abd Hall Brev)  30.9C  Ankle    3.6 <6.1 8.0 >3.0 Knee Ankle 10.2 42.0 41 >35  Knee    13.8  0.2          EMG   Side Muscle Nerve Root Ins Act Fibs Psw Amp Dur Poly Recrt Int Dennie Bible Comment  Right AbdHallucis MedPlantar S1-2 Nml Nml Nml Nml Nml 0 Nml Nml   Right AntTibialis Dp Br Peron L4-5 *Incr *3+ *3+ Nml Nml 0 *Reduced Nml   Right Fibularis Longus  Sup Br Peron L5-S1 *Incr *3+ *3+ Nml Nml 0 *Reduced Nml   Right MedGastroc Tibial S1-2 Nml Nml Nml Nml Nml 0 Nml Nml   Right VastusMed Femoral L2-4 Nml Nml Nml Nml Nml 0 Nml Nml     Nerve Conduction Studies Anti Sensory Left/Right Comparison   Stim Site L Lat (ms) R Lat (ms) L-R Lat (ms) L Amp (V) R Amp (V) L-R Amp (%) Site1 Site2 L Vel (m/s) R Vel (m/s) L-R Vel (m/s)  Saphenous Anti Sensory (Ant Med Mall)  30.7C  14cm  4.1   7.4  14cm Ant Med Mall     Sup Fibular Anti Sensory (Ant Lat Mall)  30.7C  14 cm  3.6   16.1  14 cm Ant Lat Mall  39   Sural Anti Sensory (Lat Mall)  30.8C  Calf  3.7   *3.0  Calf Lat Mall  38    Motor Left/Right Comparison   Stim Site L Lat (ms) R Lat (ms) L-R Lat (ms) L Amp (mV) R Amp (mV) L-R Amp (%) Site1 Site2 L Vel (m/s) R Vel (m/s) L-R Vel (m/s)  Fibular Motor (Ext Dig Brev)  31C  Ankle  3.6   4.0  B Fib Ankle  48   B Fib  10.7   3.5  Poplt B Fib  53   Poplt  12.4   3.4        Tibial Motor (Abd Hall Brev)  30.9C  Ankle  3.6   8.0  Knee Ankle  41   Knee  13.8   0.2           Waveforms:            Clinical History: No specialty comments available.  He reports that he has never smoked. He has never used smokeless tobacco.   Recent Labs  03/18/17 1639 04/14/17 1558  LABURIC 10.1* 3.6*    Objective:  VS:  HT:    WT:   BMI:     BP:   HR: bpm  TEMP: ( )  RESP:  Physical Exam  Musculoskeletal:  Examination of the lower extremities shows normal muscle bulk bilaterally particularly in the  EDC muscles bilaterally. There is no swelling or redness or allodynia. The patient ambulates without aid. He has an equivocally positive slump test on the right. He has no clonus. He has grossly intact sensation to light touch.    Ortho Exam Imaging: No results found.  Past Medical/Family/Surgical/Social History: Medications & Allergies reviewed per EMR Patient Active Problem List   Diagnosis Date Noted  . Idiopathic chronic gout, unspecified site, without tophus (tophi) 04/14/2017  . Arthralgia of right ankle 02/09/2017  . Right leg pain 01/11/2017   History reviewed. No pertinent past medical history. Family History  Problem Relation Age of Onset  . Hypertension Mother   . Cancer Brother    Past Surgical History:  Procedure Laterality Date  . APPENDECTOMY     Social History   Occupational History  . Not on file.   Social History Main Topics  . Smoking status: Never Smoker  . Smokeless tobacco: Never Used  . Alcohol use Yes     Comment: occ  . Drug use: Unknown  . Sexual activity: Not on file

## 2017-06-08 NOTE — Progress Notes (Deleted)
Pain in right foot. Entire right foot hurts. No numbness now, but did have some initially. No known injury.

## 2017-06-09 ENCOUNTER — Other Ambulatory Visit (INDEPENDENT_AMBULATORY_CARE_PROVIDER_SITE_OTHER): Payer: Self-pay | Admitting: Family

## 2017-06-09 DIAGNOSIS — M5416 Radiculopathy, lumbar region: Secondary | ICD-10-CM

## 2017-06-09 NOTE — Procedures (Signed)
EMG & NCV Findings: Evaluation of the right sural sensory nerve showed reduced amplitude (3.0 V).  All remaining nerves (as indicated in the following tables) were within normal limits.    Needle evaluation of the right anterior tibialis and the right Fibularis Longus muscles showed increased insertional activity, increased spontaneous activity, and diminished recruitment.  All remaining muscles (as indicated in the following table) showed no evidence of electrical instability.    Impression: The above electrodiagnostic study is ABNORMAL and reveals evidence of moderate chronic L5 radiculopathy on the right.  There is no significant electrodiagnostic evidence of any other focal nerve entrapment, lumbosacral plexopathy or generalized peripheral neuropathy.   Recommendations: 1.  Follow-up with referring physician. 2.  Continue current management of symptoms. 3.  Suggest MRI of the lumbar spine to look for source of L5 radiculopathy.   Nerve Conduction Studies Anti Sensory Summary Table   Stim Site NR Peak (ms) Norm Peak (ms) P-T Amp (V) Norm P-T Amp Site1 Site2 Delta-P (ms) Dist (cm) Vel (m/s) Norm Vel (m/s)  Right Saphenous Anti Sensory (Ant Med Mall)  30.7C  14cm    4.1 <4.4 7.4 >2 14cm Ant Med Mall 4.1 0.0  >32  Right Sup Fibular Anti Sensory (Ant Lat Mall)  30.7C  14 cm    3.6 <4.4 16.1 >5.0 14 cm Ant Lat Mall 3.6 14.0 39 >32  Right Sural Anti Sensory (Lat Mall)  30.8C  Calf    3.7 <4.0 *3.0 >5.0 Calf Lat Mall 3.7 14.0 38 >35   Motor Summary Table   Stim Site NR Onset (ms) Norm Onset (ms) O-P Amp (mV) Norm O-P Amp Site1 Site2 Delta-0 (ms) Dist (cm) Vel (m/s) Norm Vel (m/s)  Right Fibular Motor (Ext Dig Brev)  31C  Ankle    3.6 <6.1 4.0 >2.5 B Fib Ankle 7.1 34.0 48 >38  B Fib    10.7  3.5  Poplt B Fib 1.7 9.0 53 >40  Poplt    12.4  3.4         Right Tibial Motor (Abd Hall Brev)  30.9C  Ankle    3.6 <6.1 8.0 >3.0 Knee Ankle 10.2 42.0 41 >35  Knee    13.8  0.2           EMG   Side Muscle Nerve Root Ins Act Fibs Psw Amp Dur Poly Recrt Int Dennie Bible Comment  Right AbdHallucis MedPlantar S1-2 Nml Nml Nml Nml Nml 0 Nml Nml   Right AntTibialis Dp Br Peron L4-5 *Incr *3+ *3+ Nml Nml 0 *Reduced Nml   Right Fibularis Longus  Sup Br Peron L5-S1 *Incr *3+ *3+ Nml Nml 0 *Reduced Nml   Right MedGastroc Tibial S1-2 Nml Nml Nml Nml Nml 0 Nml Nml   Right VastusMed Femoral L2-4 Nml Nml Nml Nml Nml 0 Nml Nml     Nerve Conduction Studies Anti Sensory Left/Right Comparison   Stim Site L Lat (ms) R Lat (ms) L-R Lat (ms) L Amp (V) R Amp (V) L-R Amp (%) Site1 Site2 L Vel (m/s) R Vel (m/s) L-R Vel (m/s)  Saphenous Anti Sensory (Ant Med Mall)  30.7C  14cm  4.1   7.4  14cm Ant Med Mall     Sup Fibular Anti Sensory (Ant Lat Mall)  30.7C  14 cm  3.6   16.1  14 cm Ant Lat Mall  39   Sural Anti Sensory (Lat Mall)  30.8C  Calf  3.7   *3.0  Calf Lat Mall  38    Motor Left/Right Comparison   Stim Site L Lat (ms) R Lat (ms) L-R Lat (ms) L Amp (mV) R Amp (mV) L-R Amp (%) Site1 Site2 L Vel (m/s) R Vel (m/s) L-R Vel (m/s)  Fibular Motor (Ext Dig Brev)  31C  Ankle  3.6   4.0  B Fib Ankle  48   B Fib  10.7   3.5  Poplt B Fib  53   Poplt  12.4   3.4        Tibial Motor (Abd Hall Brev)  30.9C  Ankle  3.6   8.0  Knee Ankle  41   Knee  13.8   0.2           Waveforms:

## 2017-06-09 NOTE — Progress Notes (Signed)
Called patient advised someone will call them to sched MRI appt. Advised Daniel Klein per [name] she would like this STAT since appt is on Monday.

## 2017-06-14 ENCOUNTER — Ambulatory Visit (INDEPENDENT_AMBULATORY_CARE_PROVIDER_SITE_OTHER): Payer: Self-pay | Admitting: Family

## 2017-06-15 ENCOUNTER — Ambulatory Visit (HOSPITAL_COMMUNITY)
Admission: RE | Admit: 2017-06-15 | Discharge: 2017-06-15 | Disposition: A | Discharge status: Home / Self Care | Payer: Self-pay | Source: Ambulatory Visit | Attending: Family | Admitting: Family

## 2017-06-15 DIAGNOSIS — M5416 Radiculopathy, lumbar region: Secondary | ICD-10-CM | POA: Insufficient documentation

## 2017-06-16 ENCOUNTER — Encounter (INDEPENDENT_AMBULATORY_CARE_PROVIDER_SITE_OTHER): Payer: Self-pay | Admitting: Family

## 2017-06-16 ENCOUNTER — Ambulatory Visit (INDEPENDENT_AMBULATORY_CARE_PROVIDER_SITE_OTHER): Payer: Self-pay | Admitting: Family

## 2017-06-16 DIAGNOSIS — M5441 Lumbago with sciatica, right side: Secondary | ICD-10-CM

## 2017-06-16 DIAGNOSIS — G8929 Other chronic pain: Secondary | ICD-10-CM

## 2017-06-16 DIAGNOSIS — M25571 Pain in right ankle and joints of right foot: Secondary | ICD-10-CM

## 2017-06-16 MED ORDER — TRAMADOL HCL 50 MG PO TABS: 50.0000 mg | ORAL_TABLET | Freq: Two times a day (BID) | ORAL | 0 refills | Status: DC

## 2017-06-16 MED ORDER — PREDNISONE 10 MG PO TABS: ORAL_TABLET | ORAL | 0 refills | Status: DC

## 2017-06-16 MED FILL — ?PREDNISONE 10 MG TABLET: 10 | 12 days supply | Qty: 42 | Fill #0

## 2017-06-16 NOTE — Progress Notes (Signed)
Office Visit Note   [name]           Date of Birth: 1986/09/08           MRN: [id number] Visit Date: 06/16/2017              Requested by: Lizbeth Bark, FNP [address] [place], [place] [address] PCP: Lizbeth Bark, FNP  Chief Complaint  Patient presents with  . Lower Back - Pain  . results    MRI      HPI: The patient is a 31 year old gentleman with a long-standing history of lateral ankle pain also having low back pain in radicular symptoms down his right side. States when he sleeps on his right side his leg is now must sleep on his back. 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  Resents today for MRI review he did have an MRI which is completely history which is revealing for  DISC LEVELS:  L1-2 thru L3-4: No disc bulge, canal stenosis nor neural foraminal narrowing.  L4-5: Retrolisthesis. 4 mm central disc protrusion with annular fissure. No canal stenosis or neural foraminal narrowing.  L5-S1: Anterolisthesis. 4 mm broad-based disc bulge annular fissure. No canal stenosis. Severe bilateral neural foraminal narrowing. T2 bright signal within the exited bilateral L5 nerves.  IMPRESSION: 1. Grade 1 L5-S1 anterolisthesis on the basis of chronic bilateral L5 pars interarticularis defects. Grade 1 L4-5 retrolisthesis. 2. No canal stenosis. Severe bilateral L5 neural foraminal narrowing with bilateral L5 neuritis.  Assessment & Plan: Visit Diagnoses:  1. Chronic right-sided low back pain with right-sided sciatica   2. Arthralgia of right ankle     Plan: We'll call in a course of prednisone. We'll have him follow-up in office in 6-8 weeks with [place] recheck his uric acid discuss ongoing treatment for gout. Have sent a referral to Dr. Alvester Morin who follow-up with him for epidural steroid injections. Provided a note for  work, no lifting greater than 15 pounds.  Follow-Up Instructions: No Follow-up on file.   Back Exam   Tenderness  The patient is experiencing tenderness in the lumbar.  Range of Motion  The patient has normal back ROM.  Muscle Strength  The patient has normal back strength.  Tests  Straight leg raise right: positive Straight leg raise left: negative  Other  Gait: normal       Patient is alert, oriented, no adenopathy, well-dressed, normal affect, normal respiratory effort.   Imaging: Mr Lumbar Spine Wo Contrast  Result Date: 06/16/2017 CLINICAL DATA:  Radiculopathy for greater than 6 weeks, failed conservative treatment. EXAM: MRI LUMBAR SPINE WITHOUT CONTRAST TECHNIQUE: Multiplanar, multisequence MR imaging of the lumbar spine was performed. No intravenous contrast was administered. COMPARISON:  None. FINDINGS: SEGMENTATION: For the purposes of this report, the last well-formed intervertebral disc will be reported as L5-S1. Transitional anatomy with partially lumbarized S1 vertebral body and rudimentary S1-2 disc. ALIGNMENT: Maintained lumbar lordosis. Grade 1 L4-5 retrolisthesis and grade 1 L5-S1 anterolisthesis. VERTEBRAE:Vertebral bodies are intact. Bilateral chronic L5 pars interarticularis defects. Moderate L5-S1 disc height loss. Disc desiccation L4-5 and L5-S1 compatible with degenerative discs. Mild subacute to chronic discogenic endplate changes L3-4 and L5-S1. Scattered old Schmorl's nodes. No suspicious or acute bone marrow signal. 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 CONUS MEDULLARIS: Conus medullaris terminates at L1-2 and demonstrates normal morphology and  signal characteristics. Cauda equina is normal. PARASPINAL AND SOFT TISSUES: Included prevertebral and paraspinal soft tissues are normal. DISC LEVELS: L1-2 thru L3-4: No disc bulge, canal stenosis nor neural foraminal narrowing. L4-5: Retrolisthesis. 4 mm central disc protrusion with  annular fissure. No canal stenosis or neural foraminal narrowing. L5-S1: Anterolisthesis. 4 mm broad-based disc bulge annular fissure. No canal stenosis. Severe bilateral neural foraminal narrowing. T2 bright signal within the exited bilateral L5 nerves. IMPRESSION: 1. Grade 1 L5-S1 anterolisthesis on the basis of chronic bilateral L5 pars interarticularis defects. Grade 1 L4-5 retrolisthesis. 2. No canal stenosis. Severe bilateral L5 neural foraminal narrowing with bilateral L5 neuritis. Electronically Signed   By: Awilda Metroourtnay  Bloomer M.D.   On: 06/16/2017 04:16   No images are attached to the encounter.  Labs: Lab Results  Component Value Date   HGBA1C 5.5 09/07/2014   LABURIC 3.6 (L) 04/14/2017   LABURIC 10.1 (H) 03/18/2017    Orders:  Orders Placed This Encounter  Procedures  . Ambulatory referral to Physical Medicine Rehab   Meds ordered this encounter  Medications  . traMADol (ULTRAM) 50 MG tablet    Sig: Take 1 tablet (50 mg total) by mouth 2 (two) times daily.    Dispense:  30 tablet    Refill:  0  . predniSONE (DELTASONE) 10 MG tablet    Sig: 6 tablets for 2 days, then 5 for 2 days, then 4 for 2 days, then 3  for 2 days, then 2 for 2 days, then 1 tablet for 2 days    Dispense:  42 tablet    Refill:  0     Procedures: No procedures performed  Clinical Data: No additional findings.  ROS:  All other systems negative, except as noted in the HPI. Review of Systems  Constitutional: Negative for chills and fever.  Musculoskeletal: Positive for arthralgias, back pain and myalgias. Negative for joint swelling.  Neurological: Positive for numbness. Negative for weakness.    Objective: Vital Signs: There were no vitals taken for this visit.  Specialty Comments:  No specialty comments available.  PMFS History: Patient Active Problem List   Diagnosis Date Noted  . Idiopathic chronic gout, unspecified site, without tophus (tophi) 04/14/2017  . Arthralgia of right ankle  02/09/2017  . Right leg pain 01/11/2017   No past medical history on file.  Family History  Problem Relation Age of Onset  . Hypertension Mother   . Cancer Brother     Past Surgical History:  Procedure Laterality Date  . APPENDECTOMY     Social History   Occupational History  . Not on file.   Social History Main Topics  . Smoking status: Never Smoker  . Smokeless tobacco: Never Used  . Alcohol use Yes     Comment: occ  . Drug use: Unknown  . Sexual activity: Not on file

## 2017-06-17 MED FILL — traMADol HCL 50 MG TABS: 50 | 15 days supply | Qty: 30 | Fill #0

## 2017-06-28 ENCOUNTER — Encounter (INDEPENDENT_AMBULATORY_CARE_PROVIDER_SITE_OTHER): Payer: Self-pay | Admitting: Physical Medicine and Rehabilitation

## 2017-06-30 ENCOUNTER — Telehealth (INDEPENDENT_AMBULATORY_CARE_PROVIDER_SITE_OTHER): Payer: Self-pay | Admitting: Family

## 2017-06-30 ENCOUNTER — Other Ambulatory Visit (INDEPENDENT_AMBULATORY_CARE_PROVIDER_SITE_OTHER): Payer: Self-pay | Admitting: Family

## 2017-06-30 DIAGNOSIS — M25571 Pain in right ankle and joints of right foot: Secondary | ICD-10-CM

## 2017-06-30 MED ORDER — TRAMADOL HCL 50 MG PO TABS: 50.0000 mg | ORAL_TABLET | Freq: Two times a day (BID) | ORAL | 0 refills | Status: DC

## 2017-06-30 NOTE — Telephone Encounter (Signed)
Called and talk to the pt wilfe about Rx Tramadol is ready to be pick up at the front office

## 2017-07-01 MED FILL — traMADol HCL 50 MG TABS: 50 | 15 days supply | Qty: 30 | Fill #0

## 2017-07-09 ENCOUNTER — Ambulatory Visit: Payer: Self-pay | Attending: Family Medicine

## 2017-07-12 ENCOUNTER — Ambulatory Visit (INDEPENDENT_AMBULATORY_CARE_PROVIDER_SITE_OTHER): Payer: Self-pay

## 2017-07-12 ENCOUNTER — Ambulatory Visit (INDEPENDENT_AMBULATORY_CARE_PROVIDER_SITE_OTHER): Payer: Self-pay | Admitting: Physical Medicine and Rehabilitation

## 2017-07-12 ENCOUNTER — Encounter (INDEPENDENT_AMBULATORY_CARE_PROVIDER_SITE_OTHER): Payer: Self-pay | Admitting: Physical Medicine and Rehabilitation

## 2017-07-12 VITALS — BP 129/86 | HR 72

## 2017-07-12 DIAGNOSIS — Q762 Congenital spondylolisthesis: Secondary | ICD-10-CM

## 2017-07-12 DIAGNOSIS — M5416 Radiculopathy, lumbar region: Secondary | ICD-10-CM

## 2017-07-12 DIAGNOSIS — M4800 Spinal stenosis, site unspecified: Secondary | ICD-10-CM

## 2017-07-12 DIAGNOSIS — M4316 Spondylolisthesis, lumbar region: Secondary | ICD-10-CM

## 2017-07-12 MED ORDER — LIDOCAINE HCL (PF) 1 % IJ SOLN: 2.0000 mL | Freq: Once | INTRAMUSCULAR | Status: DC

## 2017-07-12 MED ORDER — BETAMETHASONE SOD PHOS & ACET 6 (3-3) MG/ML IJ SUSP: 12.0000 mg | Freq: Once | INTRAMUSCULAR | Status: DC

## 2017-07-12 NOTE — Progress Notes (Deleted)
Right side low back pain and radiates down to right foot. Tingling in right foot. Constant pain. Can't sleep on right side.

## 2017-07-13 ENCOUNTER — Encounter (INDEPENDENT_AMBULATORY_CARE_PROVIDER_SITE_OTHER): Payer: Self-pay | Admitting: Physical Medicine and Rehabilitation

## 2017-07-13 NOTE — Progress Notes (Signed)
Daniel Klein - 31 y.o. male MRN [id number]  Date of birth: [birth year]  Office Visit Note: Visit Date: 07/12/2017 PCP: Lizbeth Bark, FNP Referred by: Thomas Hoff*  Subjective: Chief Complaint  Patient presents with  . Lower Back - Pain   HPI: [name] is a 31 year old male who I have seen in the past for electrodiagnostic study that showed consistent findings with moderate subacute on chronic L5 radiculopathy. He has been followed by Dr. Roda Shutters as well as Dr. Lajoyce Corners and Barnie Del, FNP for what has been thought of as a problem with his right ankle. When I saw him for evaluation of possible tarsal tunnel syndrome he was complaining of pain at the right ankle laterally but also pain in the thigh and low back and buttock region. He denied any left-sided complaints. He comes in today with an interpreter. He has been referred for possible L5 transforaminal epidural steroid injection. We actually had planned on this injection today but evidently the patient was still having second thoughts and wanted more information on the injection and his lumbar spine. We spent 25 minutes speaking specifically about his symptoms and his lumbar spine MRI as well as the natural history of spondylolysis/heart defects as well as spondylolisthesis. We discussed at length the role of injections and surgery. We obviously discussed the risk of the procedure. All of his questions were answered. He is going to think about whether to have the injection or not. His biggest concern is the fact that he feels like the injections are not going to fix the problem.    Review of Systems  Constitutional: Negative for chills, fever, malaise/fatigue and weight loss.  HENT: Negative for hearing loss and sinus pain.   Eyes: Negative for blurred vision, double vision and photophobia.  Respiratory: Negative for cough and shortness of breath.   Cardiovascular: Negative for chest pain, palpitations and  leg swelling.  Gastrointestinal: Negative for abdominal pain, nausea and vomiting.  Genitourinary: Negative for flank pain.  Musculoskeletal: Positive for back pain and joint pain. Negative for myalgias.  Skin: Negative for itching and rash.  Neurological: Positive for weakness. Negative for tremors and focal weakness.  Endo/Heme/Allergies: Negative.   Psychiatric/Behavioral: Negative for depression.  All other systems reviewed and are negative.  Otherwise per HPI.  Assessment & Plan: Visit Diagnoses:  1. Lumbar radiculopathy   2. Spondylolisthesis of lumbar region   3. Congenital spondylolysis   4. Spinal stenosis, unspecified spinal region     Plan: Findings:  History and assessment really as per the history of present illness above. Patient has EMG findings of radiculopathy at L5. He has MRI evidence of grade 1 listhesis due to pars defects at L5 on S1 with severe bi-foraminal narrowing. This is likely the source of his pain. This could give him lateral ankle pain in an L5 distribution and this is what we have seen in the past. We talked at length about week and be for sure if it's causing his ankle pain that's what reason to complete the diagnostic injection. He is going to think about it. Interpreter was present.    Meds & Orders:  Meds ordered this encounter  Medications  . lidocaine (PF) (XYLOCAINE) 1 % injection 2 mL  . betamethasone acetate-betamethasone sodium phosphate (CELESTONE) injection 12 mg   No orders of the defined types were placed in this encounter.   Follow-up: Return if symptoms worsen or fail to improve.   Procedures: No procedures performed  No  notes on file   Clinical History: MRI LUMBAR SPINE WITHOUT CONTRAST  TECHNIQUE: Multiplanar, multisequence MR imaging of the lumbar spine was performed. No intravenous contrast was administered.  COMPARISON:  None.  FINDINGS: SEGMENTATION: For the purposes of this report, the last  well-formed intervertebral disc will be reported as L5-S1. Transitional anatomy with partially lumbarized S1 vertebral body and rudimentary S1-2 disc.  ALIGNMENT: Maintained lumbar lordosis. Grade 1 L4-5 retrolisthesis and grade 1 L5-S1 anterolisthesis.  VERTEBRAE:Vertebral bodies are intact. Bilateral chronic L5 pars interarticularis defects. Moderate L5-S1 disc height loss. Disc desiccation L4-5 and L5-S1 compatible with degenerative discs. Mild subacute to chronic discogenic endplate changes L3-4 and L5-S1. Scattered old Schmorl's nodes. No suspicious or acute bone marrow signal. 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  CONUS MEDULLARIS: Conus medullaris terminates at L1-2 and demonstrates normal morphology and signal characteristics. Cauda equina is normal.  PARASPINAL AND SOFT TISSUES: Included prevertebral and paraspinal soft tissues are normal.  DISC LEVELS:  L1-2 thru L3-4: No disc bulge, canal stenosis nor neural foraminal narrowing.  L4-5: Retrolisthesis. 4 mm central disc protrusion with annular fissure. No canal stenosis or neural foraminal narrowing.  L5-S1: Anterolisthesis. 4 mm broad-based disc bulge annular fissure. No canal stenosis. Severe bilateral neural foraminal narrowing. T2 bright signal within the exited bilateral L5 nerves.  IMPRESSION: 1. Grade 1 L5-S1 anterolisthesis on the basis of chronic bilateral L5 pars interarticularis defects. Grade 1 L4-5 retrolisthesis. 2. No canal stenosis. Severe bilateral L5 neural foraminal narrowing with bilateral L5 neuritis.   Electronically Signed   By: Awilda Metro M.D.   On: 06/16/2017 04:16  He reports that he has never smoked. He has never used smokeless tobacco.   Recent Labs  03/18/17 1639 04/14/17 1558  LABURIC 10.1* 3.6*    Objective:  VS:  HT:    WT:   BMI:     BP:129/86  HR:72bpm  TEMP: ( )  RESP:100 % Physical Exam  Constitutional: He is  oriented to person, place, and time. He appears well-developed and well-nourished. No distress.  HENT:  Head: Normocephalic and atraumatic.  Eyes: Pupils are equal, round, and reactive to light. Conjunctivae are normal.  Neck: Normal range of motion. Neck supple.  Cardiovascular: Regular rhythm and intact distal pulses.   Pulmonary/Chest: Effort normal. No respiratory distress.  Musculoskeletal:  Patient ambulates without aid. He has no difficulty going from sit to stand. He has good distal strength.  Neurological: He is alert and oriented to person, place, and time. He exhibits normal muscle tone. Coordination normal.  Skin: Skin is warm and dry. No rash noted. No erythema.  Psychiatric: He has a normal mood and affect.  Nursing note and vitals reviewed.   Ortho Exam Imaging: No results found.  Past Medical/Family/Surgical/Social History: Medications & Allergies reviewed per EMR Patient Active Problem List   Diagnosis Date Noted  . Idiopathic chronic gout, unspecified site, without tophus (tophi) 04/14/2017  . Arthralgia of right ankle 02/09/2017  . Right leg pain 01/11/2017   No past medical history on file. Family History  Problem Relation Age of Onset  . Hypertension Mother   . Cancer Brother    Past Surgical History:  Procedure Laterality Date  . APPENDECTOMY     Social History   Occupational History  . Not on file.   Social History Main Topics  . Smoking status: Never Smoker  . Smokeless tobacco: Never Used  . Alcohol use Yes     Comment: occ  .  Drug use: Unknown  . Sexual activity: Not on file

## 2017-07-15 ENCOUNTER — Ambulatory Visit: Payer: Self-pay

## 2017-07-19 ENCOUNTER — Ambulatory Visit (INDEPENDENT_AMBULATORY_CARE_PROVIDER_SITE_OTHER): Payer: Self-pay | Admitting: Family

## 2017-07-28 ENCOUNTER — Other Ambulatory Visit (INDEPENDENT_AMBULATORY_CARE_PROVIDER_SITE_OTHER): Payer: Self-pay | Admitting: Orthopedic Surgery

## 2017-07-28 DIAGNOSIS — M25571 Pain in right ankle and joints of right foot: Secondary | ICD-10-CM

## 2017-07-28 MED ORDER — TRAMADOL HCL 50 MG PO TABS: 50.0000 mg | ORAL_TABLET | Freq: Two times a day (BID) | ORAL | 0 refills | Status: DC

## 2017-07-28 NOTE — Telephone Encounter (Signed)
Pt wife came in and needed to get prescription filled. Pt specified that she needed the prescription to change to twice a day. The system shows that the prescription is right based upon the information given to me by patients wife.   Interpreter Needed  Lannette Donath [phone] I called [employer] to get this information provided.  Tramadol(Ultram)50 mg tablet take 1 tablet ( ) total by mouth 2 (two) daily.

## 2017-07-28 NOTE — Telephone Encounter (Signed)
I called and left vm on pharmacy line for refill of tramadol.

## 2017-07-29 MED FILL — traMADol HCL 50 MG TABS: 50 | 15 days supply | Qty: 30 | Fill #0

## 2017-08-31 ENCOUNTER — Other Ambulatory Visit (INDEPENDENT_AMBULATORY_CARE_PROVIDER_SITE_OTHER): Payer: Self-pay | Admitting: Family

## 2017-08-31 DIAGNOSIS — M25571 Pain in right ankle and joints of right foot: Secondary | ICD-10-CM

## 2017-08-31 MED ORDER — TRAMADOL HCL 50 MG PO TABS: 50.0000 mg | ORAL_TABLET | Freq: Four times a day (QID) | ORAL | 0 refills | Status: DC | PRN

## 2017-08-31 MED ORDER — TRAMADOL HCL 50 MG PO TABS: 50.0000 mg | ORAL_TABLET | Freq: Two times a day (BID) | ORAL | 0 refills | Status: DC

## 2017-08-31 NOTE — Telephone Encounter (Signed)
[facility]. Any additional medications would require in office eval. Tramadol will not be prescribed per MD on chronic basis.

## 2017-08-31 NOTE — Telephone Encounter (Signed)
Patients wife is here for refill on Tramadol[facility]

## 2017-08-31 NOTE — Telephone Encounter (Signed)
Ok rx 60

## 2017-08-31 NOTE — Telephone Encounter (Signed)
Deleted rx for 1 po q 6hrs. Called only tramadol 1 po bid.

## 2017-08-31 NOTE — Telephone Encounter (Signed)
[facility]ill [facility]

## 2017-09-02 MED FILL — traMADol HCL 50 MG TABS: 50 | 30 days supply | Qty: 60 | Fill #0

## 2017-10-06 ENCOUNTER — Telehealth (INDEPENDENT_AMBULATORY_CARE_PROVIDER_SITE_OTHER): Payer: Self-pay | Admitting: Orthopedic Surgery

## 2017-10-06 NOTE — Telephone Encounter (Signed)
Patient's wife called requesting an RX refill on the patient's Tramadol.  She stated that he is out of his medication and would like to get this today.  CB#[phone].  Thank you.

## 2017-10-06 NOTE — Telephone Encounter (Signed)
I called and advised that he has not been seen in the office since 05/2017 and that he needs to be seen in the office for eval. appt made for tomorrow with erin.

## 2017-10-07 ENCOUNTER — Ambulatory Visit (INDEPENDENT_AMBULATORY_CARE_PROVIDER_SITE_OTHER): Payer: Self-pay | Admitting: Family

## 2017-10-07 ENCOUNTER — Encounter (INDEPENDENT_AMBULATORY_CARE_PROVIDER_SITE_OTHER): Payer: Self-pay | Admitting: Family

## 2017-10-07 DIAGNOSIS — M5416 Radiculopathy, lumbar region: Secondary | ICD-10-CM

## 2017-10-07 DIAGNOSIS — M1A0710 Idiopathic chronic gout, right ankle and foot, without tophus (tophi): Secondary | ICD-10-CM

## 2017-10-07 LAB — URIC ACID: Uric Acid, Serum: 6.2 mg/dL (ref 4.0–8.0)

## 2017-10-07 MED ORDER — PREDNISONE 50 MG PO TABS: ORAL_TABLET | ORAL | 0 refills | Status: DC

## 2017-10-07 MED ORDER — TRAMADOL HCL 50 MG PO TABS: 50.0000 mg | ORAL_TABLET | Freq: Two times a day (BID) | ORAL | 0 refills | Status: DC

## 2017-10-07 NOTE — Progress Notes (Signed)
Office Visit Note   [name]           Date of Birth: [birth year]           MRN: [id number] Visit Date: 10/07/2017              Requested by: Lizbeth BarkHairston, Mandesia R, FNP [address] [place], [place] [address] PCP: Lizbeth BarkHairston, Mandesia R, FNP  No chief complaint on file.     HPI: The patient is a 31 year old gentleman with a long-standing history of lateral ankle pain also having right hip pain with radicular symptoms down his lateral right leg. States when he sleeps on his right side his leg is now must sleep on his back.   Has been contemplating ESI. Has had appointment with him to discuss this with Dr. Alvester MorinNewton. Today would like to discuss further the options for his pain, ESI, surgery etc.   Wondering if can stop the gout medication.  Assessment & Plan: Visit Diagnoses:  1. Idiopathic chronic gout of right ankle without tophus   2. Lumbar radiculopathy     Plan: discussed ESI at length. Patient amenable to trial of injection.  We'll call in a course of prednisone. We'll have him follow-up in office 6-8 weeks after stopping his allopurinol. Will recheck his uric acid today.  discuss ongoing treatment for gout at follow up in 3 months.   Have sent a referral to Dr. Alvester MorinNewton who follow-up with him for epidural steroid injections.    Follow-Up Instructions: Return in about 3 months (around 01/05/2018).   Back Exam   Tenderness  The patient is experiencing tenderness in the lumbar.  Range of Motion  The patient has normal back ROM.  Muscle Strength  The patient has normal back strength.  Tests  Straight leg raise right: positive Straight leg raise left: negative  Other  Gait: normal       Patient is alert, oriented, no adenopathy, well-dressed, normal affect, normal respiratory effort.   Imaging: No results found. No images are attached to the encounter.  Labs: Lab Results  Component Value Date   HGBA1C 5.5 09/07/2014   LABURIC  3.6 (L) 04/14/2017   LABURIC 10.1 (H) 03/18/2017    Orders:  Orders Placed This Encounter  Procedures  . Uric acid  . Ambulatory referral to Physical Medicine Rehab   Meds ordered this encounter  Medications  . traMADol (ULTRAM) 50 MG tablet    Sig: Take 1 tablet (50 mg total) by mouth 2 (two) times daily.    Dispense:  60 tablet    Refill:  0  . predniSONE (DELTASONE) 50 MG tablet    Sig: Take 50 mg by mouth once daily    Dispense:  5 tablet    Refill:  0     Procedures: No procedures performed  Clinical Data: No additional findings.  ROS:  All other systems negative, except as noted in the HPI. Review of Systems  Constitutional: Negative for chills and fever.  Musculoskeletal: Positive for arthralgias, back pain and myalgias. Negative for joint swelling.  Neurological: Positive for numbness. Negative for weakness.    Objective: Vital Signs: There were no vitals taken for this visit.  Specialty Comments:  No specialty comments available.  PMFS History: Patient Active Problem List   Diagnosis Date Noted  . Idiopathic chronic gout, unspecified site, without tophus (tophi) 04/14/2017  . Arthralgia of right ankle 02/09/2017  . Right leg pain 01/11/2017   History reviewed. No pertinent past  medical history.  Family History  Problem Relation Age of Onset  . Hypertension Mother   . Cancer Brother     Past Surgical History:  Procedure Laterality Date  . APPENDECTOMY     Social History   Occupational History  . Not on file  Tobacco Use  . Smoking status: Never Smoker  . Smokeless tobacco: Never Used  Substance and Sexual Activity  . Alcohol use: Yes    Comment: occ  . Drug use: Not on file  . Sexual activity: Not on file

## 2017-10-08 MED FILL — ?PREDNISONE 10 MG TABLET: 10 | 5 days supply | Qty: 25 | Fill #0

## 2017-10-08 MED FILL — traMADol HCL 50 MG TABS: 50 | 30 days supply | Qty: 60 | Fill #0

## 2017-10-22 ENCOUNTER — Ambulatory Visit: Payer: Self-pay | Attending: Family Medicine

## 2017-10-26 ENCOUNTER — Encounter (INDEPENDENT_AMBULATORY_CARE_PROVIDER_SITE_OTHER): Payer: Self-pay | Admitting: Physical Medicine and Rehabilitation

## 2017-10-26 ENCOUNTER — Ambulatory Visit (INDEPENDENT_AMBULATORY_CARE_PROVIDER_SITE_OTHER): Payer: Self-pay | Admitting: Physical Medicine and Rehabilitation

## 2017-10-26 ENCOUNTER — Ambulatory Visit (INDEPENDENT_AMBULATORY_CARE_PROVIDER_SITE_OTHER): Payer: Self-pay

## 2017-10-26 VITALS — BP 149/89 | HR 76 | Temp 98.2°F

## 2017-10-26 DIAGNOSIS — M5416 Radiculopathy, lumbar region: Secondary | ICD-10-CM

## 2017-10-26 DIAGNOSIS — M4316 Spondylolisthesis, lumbar region: Secondary | ICD-10-CM

## 2017-10-26 DIAGNOSIS — Q762 Congenital spondylolisthesis: Secondary | ICD-10-CM

## 2017-10-26 MED ORDER — BETAMETHASONE SOD PHOS & ACET 6 (3-3) MG/ML IJ SUSP
12.0000 mg | Freq: Once | INTRAMUSCULAR | Status: AC
  Administered 2017-10-26: 12 mg

## 2017-10-26 NOTE — Progress Notes (Deleted)
Pt states a constant pain in right hip and right thigh. Pt states numbness that comes and goes. Pt states pain has been going on for about 1 year. Pt states standing for more than 2 hours makes pain worse, pt states pain medication makes pain better. +Driver, -BT, -Dye Allergies.

## 2017-10-26 NOTE — Patient Instructions (Signed)

## 2017-10-28 NOTE — Procedures (Signed)
[name] is a 32 year old gentleman who comes in today for planned right L5 transforaminal epidural steroid injection.  Please see our prior notes for evaluation and management as well as notes by Dr. Lajoyce Corners and Barnie Del.  Lumbosacral Transforaminal Epidural Steroid Injection - Sub-Pedicular Approach with Fluoroscopic Guidance  [name]      Date of Birth: Oct 29, 1985 MRN: [id number] PCP: Lizbeth Bark, FNP      Visit Date: 10/26/2017   Universal Protocol:    Date/Time: 10/26/2017  Consent Given By: the patient  Position: PRONE  Additional Comments: Vital signs were monitored before and after the procedure. Patient was prepped and draped in the usual sterile fashion. The correct patient, procedure, and site was verified.   Injection Procedure Details:  Procedure Site One Meds Administered:  Meds ordered this encounter  Medications  . betamethasone acetate-betamethasone sodium phosphate (CELESTONE) injection 12 mg    Laterality: Right  Location/Site:  L5-S1  Needle size: 22 G  Needle type: Spinal  Needle Placement: Transforaminal  Findings:    -Comments: Excellent flow of contrast along the nerve and into the epidural space.  Procedure Details: After squaring off the end-plates to get a true AP view, the C-arm was positioned so that an oblique view of the foramen as noted above was visualized. The target area is just inferior to the "nose of the scotty dog" or sub pedicular. The soft tissues overlying this structure were infiltrated with 2-3 ml. of 1% Lidocaine without Epinephrine.  The spinal needle was inserted toward the target using a "trajectory" view along the fluoroscope beam.  Under AP and lateral visualization, the needle was advanced so it did not puncture dura and was located close the 6 O'Clock position of the pedical in AP tracterory. Biplanar projections were used to confirm position. Aspiration was confirmed to  be negative for CSF and/or blood. A 1-2 ml. volume of Isovue-250 was injected and flow of contrast was noted at each level. Radiographs were obtained for documentation purposes.   After attaining the desired flow of contrast documented above, a 0.5 to 1.0 ml test dose of 0.25% Marcaine was injected into each respective transforaminal space.  The patient was observed for 90 seconds post injection.  After no sensory deficits were reported, and normal lower extremity motor function was noted,   the above injectate was administered so that equal amounts of the injectate were placed at each foramen (level) into the transforaminal epidural space.   Additional Comments:  The patient tolerated the procedure well Dressing: Band-Aid    Post-procedure details: Patient was observed during the procedure. Post-procedure instructions were reviewed.  Patient left the clinic in stable condition.   Pertinent Imaging: MRI LUMBAR SPINE WITHOUT CONTRAST  TECHNIQUE: Multiplanar, multisequence MR imaging of the lumbar spine was performed. No intravenous contrast was administered.  COMPARISON:  None.  FINDINGS: SEGMENTATION: For the purposes of this report, the last well-formed intervertebral disc will be reported as L5-S1. Transitional anatomy with partially lumbarized S1 vertebral body and rudimentary S1-2 disc.  ALIGNMENT: Maintained lumbar lordosis. Grade 1 L4-5 retrolisthesis and grade 1 L5-S1 anterolisthesis.  VERTEBRAE:Vertebral bodies are intact. Bilateral chronic L5 pars interarticularis defects. Moderate L5-S1 disc height loss. Disc desiccation L4-5 and L5-S1 compatible with degenerative discs. Mild subacute to chronic discogenic endplate changes L3-4 and L5-S1. Scattered old Schmorl's nodes. No suspicious or acute bone marrow signal. 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  CONUS MEDULLARIS: Conus medullaris terminates at L1-2 and demonstrates normal  morphology and signal characteristics. Cauda equina is normal.  PARASPINAL AND SOFT TISSUES: Included prevertebral and paraspinal soft tissues are normal.  DISC LEVELS:  L1-2 thru L3-4: No disc bulge, canal stenosis nor neural foraminal narrowing.  L4-5: Retrolisthesis. 4 mm central disc protrusion with annular fissure. No canal stenosis or neural foraminal narrowing.  L5-S1: Anterolisthesis. 4 mm broad-based disc bulge annular fissure. No canal stenosis. Severe bilateral neural foraminal narrowing. T2 bright signal within the exited bilateral L5 nerves.  IMPRESSION: 1. Grade 1 L5-S1 anterolisthesis on the basis of chronic bilateral L5 pars interarticularis defects. Grade 1 L4-5 retrolisthesis. 2. No canal stenosis. Severe bilateral L5 neural foraminal narrowing with bilateral L5 neuritis.   Electronically Signed   By: Awilda Metroourtnay  Bloomer M.D.   On: 06/16/2017 04:16

## 2017-12-01 ENCOUNTER — Ambulatory Visit (INDEPENDENT_AMBULATORY_CARE_PROVIDER_SITE_OTHER): Payer: Self-pay | Admitting: Family

## 2017-12-03 ENCOUNTER — Ambulatory Visit (INDEPENDENT_AMBULATORY_CARE_PROVIDER_SITE_OTHER): Payer: Self-pay | Admitting: Family

## 2017-12-06 ENCOUNTER — Other Ambulatory Visit (INDEPENDENT_AMBULATORY_CARE_PROVIDER_SITE_OTHER): Payer: Self-pay | Admitting: Family

## 2017-12-06 ENCOUNTER — Telehealth (INDEPENDENT_AMBULATORY_CARE_PROVIDER_SITE_OTHER): Payer: Self-pay | Admitting: Orthopedic Surgery

## 2017-12-06 MED ORDER — TRAMADOL HCL 50 MG PO TABS: 50.0000 mg | ORAL_TABLET | Freq: Two times a day (BID) | ORAL | 0 refills | Status: DC

## 2017-12-06 NOTE — Telephone Encounter (Signed)
inj helped 2-3 weeks and still having pain.

## 2017-12-06 NOTE — Telephone Encounter (Signed)
Patient called needing Rx refill (Tramadol) The number to contact patient is [phone]

## 2017-12-06 NOTE — Telephone Encounter (Signed)
See message.

## 2017-12-06 NOTE — Telephone Encounter (Signed)
Please address

## 2017-12-06 NOTE — Telephone Encounter (Signed)
rx has been called into pharm and pt is aware.

## 2017-12-06 NOTE — Telephone Encounter (Signed)
Patient requesting RX refill on Tramadol. # [phone]

## 2017-12-07 ENCOUNTER — Telehealth (INDEPENDENT_AMBULATORY_CARE_PROVIDER_SITE_OTHER): Payer: Self-pay

## 2017-12-07 ENCOUNTER — Other Ambulatory Visit (INDEPENDENT_AMBULATORY_CARE_PROVIDER_SITE_OTHER): Payer: Self-pay

## 2017-12-07 DIAGNOSIS — M545 Low back pain: Principal | ICD-10-CM

## 2017-12-07 DIAGNOSIS — G8929 Other chronic pain: Secondary | ICD-10-CM

## 2017-12-07 MED FILL — traMADol HCL 50 MG TABS: 50 | 20 days supply | Qty: 40 | Fill #0

## 2017-12-07 NOTE — Telephone Encounter (Signed)
Patients wife called and states Rx was never called into pharm. They do not have anything. I advised her and the pharmacist that it was called in by a coworker yesterday and left on voicemail.   Called it in again today actually spoke to someone to make sure this can be done.  Called patients wife to advise Per pharmacist Rx should be ready before they close Today.

## 2017-12-07 NOTE — Telephone Encounter (Signed)
I called to advise that this had been called in yesterday but that I called [facility] [address]. Advised to call me if they get there and are advised that the rx is not ready.

## 2017-12-07 NOTE — Telephone Encounter (Signed)
Patient's daughter stated that her dad's pharmacy does not have his prescription for his Tramadol.

## 2017-12-07 NOTE — Telephone Encounter (Signed)
Ok for injection to see if it lasts longer would be standard yes. Just put in referral que

## 2017-12-07 NOTE — Telephone Encounter (Signed)
Patient wife called and would like to know if patient can get another injection with Dr Alvester MorinNewton. They have a scheduled appt with Dr Lajoyce Cornersuda on 12/15/17.  Royann Shivers.  CB [phone]

## 2017-12-07 NOTE — Telephone Encounter (Signed)
I put an order in the chart for this pt to have injection based off of message from yesterday just FYI wife called again today

## 2017-12-08 ENCOUNTER — Other Ambulatory Visit (INDEPENDENT_AMBULATORY_CARE_PROVIDER_SITE_OTHER): Payer: Self-pay | Admitting: Family

## 2017-12-08 DIAGNOSIS — M5416 Radiculopathy, lumbar region: Secondary | ICD-10-CM

## 2017-12-15 ENCOUNTER — Ambulatory Visit (INDEPENDENT_AMBULATORY_CARE_PROVIDER_SITE_OTHER): Payer: Self-pay | Admitting: Orthopedic Surgery

## 2017-12-27 ENCOUNTER — Ambulatory Visit (INDEPENDENT_AMBULATORY_CARE_PROVIDER_SITE_OTHER): Payer: Self-pay

## 2017-12-27 ENCOUNTER — Ambulatory Visit (INDEPENDENT_AMBULATORY_CARE_PROVIDER_SITE_OTHER): Payer: Self-pay | Admitting: Physical Medicine and Rehabilitation

## 2017-12-27 ENCOUNTER — Encounter (INDEPENDENT_AMBULATORY_CARE_PROVIDER_SITE_OTHER): Payer: Self-pay | Admitting: Physical Medicine and Rehabilitation

## 2017-12-27 VITALS — BP 137/91 | HR 70 | Temp 98.4°F

## 2017-12-27 DIAGNOSIS — M5416 Radiculopathy, lumbar region: Secondary | ICD-10-CM

## 2017-12-27 DIAGNOSIS — Q762 Congenital spondylolisthesis: Secondary | ICD-10-CM

## 2017-12-27 DIAGNOSIS — M4316 Spondylolisthesis, lumbar region: Secondary | ICD-10-CM

## 2017-12-27 MED ORDER — BETAMETHASONE SOD PHOS & ACET 6 (3-3) MG/ML IJ SUSP
12.0000 mg | Freq: Once | INTRAMUSCULAR | Status: AC
  Administered 2017-12-27: 12 mg

## 2017-12-27 NOTE — Progress Notes (Signed)
.  Numeric Pain Rating Scale and Functional Assessment Average Pain 9   In the last MONTH (on 0-10 scale) has pain interfered with the following?  1. General activity like being  able to carry out your everyday physical activities such as walking, climbing stairs, carrying groceries, or moving a chair?  Rating(8)   +Driver, -BT, -Dye Allergies.  

## 2017-12-27 NOTE — Patient Instructions (Signed)

## 2017-12-28 ENCOUNTER — Ambulatory Visit (INDEPENDENT_AMBULATORY_CARE_PROVIDER_SITE_OTHER): Payer: Self-pay | Admitting: Orthopedic Surgery

## 2017-12-29 NOTE — Progress Notes (Signed)
Daniel Klein - 32 y.o. male MRN [id number]  Date of birth: [birth year]  Office Visit Note: Visit Date: 12/27/2017 PCP: Lizbeth BarkHairston, Mandesia R, FNP Referred by: Thomas HoffHairston, Mandesia R, F*  Subjective: Chief Complaint  Patient presents with  . Lower Back - Pain  . Right Leg - Pain   HPI: [name] is a 32 year old gentleman that has been followed in our office by Dr. Roda ShuttersXu and Dr. Lajoyce Cornersuda.  We completed right L5 transforaminal injection several months ago with really pretty decent relief for a few weeks.  He reports that during the time that it did help it relieving the symptoms down to the ankle.  He comes in today at the request of Dr. Roda ShuttersXu for repeat injection.    ROS Otherwise per HPI.  Assessment & Plan: Visit Diagnoses:  1. Lumbar radiculopathy   2. Spondylolisthesis of lumbar region   3. Congenital spondylolysis     Plan: No additional findings.   Meds & Orders:  Meds ordered this encounter  Medications  . betamethasone acetate-betamethasone sodium phosphate (CELESTONE) injection 12 mg    Orders Placed This Encounter  Procedures  . XR C-ARM NO REPORT  . Epidural Steroid injection    Follow-up: Return if symptoms worsen or fail to improve.   Procedures: No procedures performed  Lumbosacral Transforaminal Epidural Steroid Injection - Sub-Pedicular Approach with Fluoroscopic Guidance  [name]      Date of Birth: [birth year] MRN: [id number] PCP: Lizbeth BarkHairston, Mandesia R, FNP      Visit Date: 12/27/2017   Universal Protocol:    Date/Time: 12/27/2017  Consent Given By: the patient  Position: PRONE  Additional Comments: Vital signs were monitored before and after the procedure. Patient was prepped and draped in the usual sterile fashion. The correct patient, procedure, and site was verified.   Injection Procedure Details:  Procedure Site One Meds Administered:  Meds ordered this encounter  Medications  . betamethasone  acetate-betamethasone sodium phosphate (CELESTONE) injection 12 mg    Laterality: Right  Location/Site: Patient does have a transitional segment with a lumbarized S1 segment. L5-S1  Needle size: 22 G  Needle type: Spinal  Needle Placement: Transforaminal  Findings:    -Comments: Excellent flow of contrast along the nerve and into the epidural space.  Procedure Details: After squaring off the end-plates to get a true AP view, the C-arm was positioned so that an oblique view of the foramen as noted above was visualized. The target area is just inferior to the "nose of the scotty dog" or sub pedicular. The soft tissues overlying this structure were infiltrated with 2-3 ml. of 1% Lidocaine without Epinephrine.  The spinal needle was inserted toward the target using a "trajectory" view along the fluoroscope beam.  Under AP and lateral visualization, the needle was advanced so it did not puncture dura and was located close the 6 O'Clock position of the pedical in AP tracterory. Biplanar projections were used to confirm position. Aspiration was confirmed to be negative for CSF and/or blood. A 1-2 ml. volume of Isovue-250 was injected and flow of contrast was noted at each level. Radiographs were obtained for documentation purposes.   After attaining the desired flow of contrast documented above, a 0.5 to 1.0 ml test dose of 0.25% Marcaine was injected into each respective transforaminal space.  The patient was observed for 90 seconds post injection.  After no sensory deficits were reported, and normal lower extremity motor function was noted,   the above injectate was administered  so that equal amounts of the injectate were placed at each foramen (level) into the transforaminal epidural space.   Additional Comments:  The patient tolerated the procedure well Dressing: Band-Aid    Post-procedure details: Patient was observed during the procedure. Post-procedure instructions were  reviewed.  Patient left the clinic in stable condition.    Clinical History: MRI LUMBAR SPINE WITHOUT CONTRAST  TECHNIQUE: Multiplanar, multisequence MR imaging of the lumbar spine was performed. No intravenous contrast was administered.  COMPARISON:  None.  FINDINGS: SEGMENTATION: For the purposes of this report, the last well-formed intervertebral disc will be reported as L5-S1. Transitional anatomy with partially lumbarized S1 vertebral body and rudimentary S1-2 disc.  ALIGNMENT: Maintained lumbar lordosis. Grade 1 L4-5 retrolisthesis and grade 1 L5-S1 anterolisthesis.  VERTEBRAE:Vertebral bodies are intact. Bilateral chronic L5 pars interarticularis defects. Moderate L5-S1 disc height loss. Disc desiccation L4-5 and L5-S1 compatible with degenerative discs. Mild subacute to chronic discogenic endplate changes L3-4 and L5-S1. Scattered old Schmorl's nodes. No suspicious or acute bone marrow signal. 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  CONUS MEDULLARIS: Conus medullaris terminates at L1-2 and demonstrates normal morphology and signal characteristics. Cauda equina is normal.  PARASPINAL AND SOFT TISSUES: Included prevertebral and paraspinal soft tissues are normal.  DISC LEVELS:  L1-2 thru L3-4: No disc bulge, canal stenosis nor neural foraminal narrowing.  L4-5: Retrolisthesis. 4 mm central disc protrusion with annular fissure. No canal stenosis or neural foraminal narrowing.  L5-S1: Anterolisthesis. 4 mm broad-based disc bulge annular fissure. No canal stenosis. Severe bilateral neural foraminal narrowing. T2 bright signal within the exited bilateral L5 nerves.  IMPRESSION: 1. Grade 1 L5-S1 anterolisthesis on the basis of chronic bilateral L5 pars interarticularis defects. Grade 1 L4-5 retrolisthesis. 2. No canal stenosis. Severe bilateral L5 neural foraminal narrowing with bilateral L5 neuritis.   Electronically  Signed   By: Awilda Metro M.D.   On: 06/16/2017 04:16   He reports that  has never smoked. he has never used smokeless tobacco.  Recent Labs    03/18/17 1639 04/14/17 1558 10/07/17 1524  LABURIC 10.1* 3.6* 6.2    Objective:  VS:  HT:    WT:   BMI:     BP:(!) 137/91  HR:70bpm  TEMP:98.4 F (36.9 C)(Oral)  RESP:95 % Physical Exam  Ortho Exam Imaging: No results found.  Past Medical/Family/Surgical/Social History: Medications & Allergies reviewed per EMR, new medications updated. Patient Active Problem List   Diagnosis Date Noted  . Idiopathic chronic gout, unspecified site, without tophus (tophi) 04/14/2017  . Arthralgia of right ankle 02/09/2017  . Right leg pain 01/11/2017   History reviewed. No pertinent past medical history. Family History  Problem Relation Age of Onset  . Hypertension Mother   . Cancer Brother    Past Surgical History:  Procedure Laterality Date  . APPENDECTOMY     Social History   Occupational History  . Not on file  Tobacco Use  . Smoking status: Never Smoker  . Smokeless tobacco: Never Used  Substance and Sexual Activity  . Alcohol use: Yes    Comment: occ  . Drug use: Not on file  . Sexual activity: Not on file

## 2017-12-29 NOTE — Procedures (Signed)
Lumbosacral Transforaminal Epidural Steroid Injection - Sub-Pedicular Approach with Fluoroscopic Guidance  [name]      Date of Birth: August 08, 1986 MRN: [id number] PCP: Lizbeth BarkHairston, Mandesia R, FNP      Visit Date: 12/27/2017   Universal Protocol:    Date/Time: 12/27/2017  Consent Given By: the patient  Position: PRONE  Additional Comments: Vital signs were monitored before and after the procedure. Patient was prepped and draped in the usual sterile fashion. The correct patient, procedure, and site was verified.   Injection Procedure Details:  Procedure Site One Meds Administered:  Meds ordered this encounter  Medications  . betamethasone acetate-betamethasone sodium phosphate (CELESTONE) injection 12 mg    Laterality: Right  Location/Site: Patient does have a transitional segment with a lumbarized S1 segment. L5-S1  Needle size: 22 G  Needle type: Spinal  Needle Placement: Transforaminal  Findings:    -Comments: Excellent flow of contrast along the nerve and into the epidural space.  Procedure Details: After squaring off the end-plates to get a true AP view, the C-arm was positioned so that an oblique view of the foramen as noted above was visualized. The target area is just inferior to the "nose of the scotty dog" or sub pedicular. The soft tissues overlying this structure were infiltrated with 2-3 ml. of 1% Lidocaine without Epinephrine.  The spinal needle was inserted toward the target using a "trajectory" view along the fluoroscope beam.  Under AP and lateral visualization, the needle was advanced so it did not puncture dura and was located close the 6 O'Clock position of the pedical in AP tracterory. Biplanar projections were used to confirm position. Aspiration was confirmed to be negative for CSF and/or blood. A 1-2 ml. volume of Isovue-250 was injected and flow of contrast was noted at each level. Radiographs were obtained for documentation  purposes.   After attaining the desired flow of contrast documented above, a 0.5 to 1.0 ml test dose of 0.25% Marcaine was injected into each respective transforaminal space.  The patient was observed for 90 seconds post injection.  After no sensory deficits were reported, and normal lower extremity motor function was noted,   the above injectate was administered so that equal amounts of the injectate were placed at each foramen (level) into the transforaminal epidural space.   Additional Comments:  The patient tolerated the procedure well Dressing: Band-Aid    Post-procedure details: Patient was observed during the procedure. Post-procedure instructions were reviewed.  Patient left the clinic in stable condition.

## 2018-02-07 ENCOUNTER — Telehealth (INDEPENDENT_AMBULATORY_CARE_PROVIDER_SITE_OTHER): Payer: Self-pay

## 2018-02-07 ENCOUNTER — Other Ambulatory Visit (INDEPENDENT_AMBULATORY_CARE_PROVIDER_SITE_OTHER): Payer: Self-pay

## 2018-02-07 MED ORDER — TRAMADOL HCL 50 MG PO TABS: 50.0000 mg | ORAL_TABLET | Freq: Two times a day (BID) | ORAL | 0 refills | Status: DC

## 2018-02-07 MED FILL — traMADol HCL 50 MG TABS: 50 | 20 days supply | Qty: 40 | Fill #0

## 2018-02-07 NOTE — Telephone Encounter (Signed)
Patient called and would like a refill of tramadol. Please let me know when done so I can call patient. Thank you.

## 2018-02-07 NOTE — Telephone Encounter (Signed)
Called Rx into pharm. Patient aware.  

## 2018-02-07 NOTE — Telephone Encounter (Signed)
Please call in a refill prescription for the tramadol for patient.

## 2018-03-16 ENCOUNTER — Other Ambulatory Visit (INDEPENDENT_AMBULATORY_CARE_PROVIDER_SITE_OTHER): Payer: Self-pay | Admitting: Orthopedic Surgery

## 2018-03-17 NOTE — Telephone Encounter (Signed)
Ok refill? 

## 2018-03-17 NOTE — Telephone Encounter (Signed)
Refill

## 2018-03-18 ENCOUNTER — Other Ambulatory Visit (INDEPENDENT_AMBULATORY_CARE_PROVIDER_SITE_OTHER): Payer: Self-pay | Admitting: Orthopedic Surgery

## 2018-03-18 MED FILL — traMADol HCL 50 MG TABS: 50 | 20 days supply | Qty: 40 | Fill #0

## 2018-03-21 ENCOUNTER — Ambulatory Visit: Payer: Self-pay

## 2018-03-29 ENCOUNTER — Telehealth (INDEPENDENT_AMBULATORY_CARE_PROVIDER_SITE_OTHER): Payer: Self-pay | Admitting: *Deleted

## 2018-03-29 NOTE — Telephone Encounter (Signed)
Okay to repeat transforaminal injection

## 2018-03-30 NOTE — Telephone Encounter (Signed)
Pt is scheduled 04/28/18

## 2018-04-18 ENCOUNTER — Other Ambulatory Visit (INDEPENDENT_AMBULATORY_CARE_PROVIDER_SITE_OTHER): Payer: Self-pay

## 2018-04-18 ENCOUNTER — Telehealth (INDEPENDENT_AMBULATORY_CARE_PROVIDER_SITE_OTHER): Payer: Self-pay | Admitting: Orthopedic Surgery

## 2018-04-18 MED ORDER — TRAMADOL HCL 50 MG PO TABS: 50.0000 mg | ORAL_TABLET | Freq: Two times a day (BID) | ORAL | 0 refills | Status: DC

## 2018-04-18 NOTE — Telephone Encounter (Signed)
rx lm on vm for [facility].

## 2018-04-18 NOTE — Telephone Encounter (Signed)
Please advise 

## 2018-04-18 NOTE — Telephone Encounter (Signed)
Okay to call in prescription for tramadol.

## 2018-04-18 NOTE — Telephone Encounter (Signed)
Patients wife Rodney BoozeYesemia called advised patient need Rx refilled (Tramadol) The number to contact Rodney BoozeYesemia is [phone]

## 2018-04-20 MED FILL — traMADol HCL 50 MG TABS: 50 | 20 days supply | Qty: 40 | Fill #0

## 2018-04-22 ENCOUNTER — Ambulatory Visit: Payer: Self-pay | Admitting: Internal Medicine

## 2018-04-28 ENCOUNTER — Ambulatory Visit (INDEPENDENT_AMBULATORY_CARE_PROVIDER_SITE_OTHER): Payer: Self-pay | Admitting: Physical Medicine and Rehabilitation

## 2018-04-28 ENCOUNTER — Ambulatory Visit (INDEPENDENT_AMBULATORY_CARE_PROVIDER_SITE_OTHER): Payer: Self-pay

## 2018-04-28 ENCOUNTER — Encounter (INDEPENDENT_AMBULATORY_CARE_PROVIDER_SITE_OTHER): Payer: Self-pay | Admitting: Physical Medicine and Rehabilitation

## 2018-04-28 ENCOUNTER — Encounter

## 2018-04-28 VITALS — BP 146/84 | HR 75

## 2018-04-28 DIAGNOSIS — Q762 Congenital spondylolisthesis: Secondary | ICD-10-CM

## 2018-04-28 DIAGNOSIS — M5416 Radiculopathy, lumbar region: Secondary | ICD-10-CM

## 2018-04-28 DIAGNOSIS — M5116 Intervertebral disc disorders with radiculopathy, lumbar region: Secondary | ICD-10-CM

## 2018-04-28 MED ORDER — BETAMETHASONE SOD PHOS & ACET 6 (3-3) MG/ML IJ SUSP
12.0000 mg | Freq: Once | INTRAMUSCULAR | Status: AC
  Administered 2018-04-28: 12 mg

## 2018-04-28 NOTE — Patient Instructions (Signed)

## 2018-04-28 NOTE — Progress Notes (Signed)
Numeric Pain Rating Scale and Functional Assessment Average Pain 9   In the last MONTH (on 0-10 scale) has pain interfered with the following?  1. General activity like being  able to carry out your everyday physical activities such as walking, climbing stairs, carrying groceries, or moving a chair?  Rating(6)    +Driver, -BT, -Dye Allergies. 

## 2018-05-11 ENCOUNTER — Ambulatory Visit (INDEPENDENT_AMBULATORY_CARE_PROVIDER_SITE_OTHER): Payer: Self-pay | Admitting: Family

## 2018-05-11 ENCOUNTER — Ambulatory Visit: Payer: Self-pay | Admitting: Nurse Practitioner

## 2018-05-12 NOTE — Progress Notes (Signed)
Daniel Klein - 32 y.o. male MRN [id number]  Date of birth: [birth year]  Office Visit Note: Visit Date: 04/28/2018 PCP: Lizbeth BarkHairston, Mandesia R, FNP Referred by: Thomas HoffHairston, Mandesia R, F*  Subjective: Chief Complaint  Patient presents with  . Lower Back - Pain  . Right Leg - Pain   HPI: [name] is a 32 year old gentleman who comes in today with an interpreter and he is having worsening low back right hip and leg pain consistent with an L5 radiculitis radiculopathy.  He is failed conservative care and his notes are well documented.  He has had 2 prior epidural injections with good relief but only lasting a couple months.  Last injection was in March and it did really well probably 80% relief for 2 months and then the symptoms started to gradually return.  He now reports 9 out of 10 pain worse with walking and standing for a long time.  By way of review MRI findings show lumbarized S1 segment with pars defects at L5-S1 with by foraminal narrowing and central protrusion both at L4-5 and L5-S1.  Depending on outcomes patient may be candidate for spine surgery evaluation or spinal cord stimulator trial.   ROS Otherwise per HPI.  Assessment & Plan: Visit Diagnoses:  1. Lumbar radiculopathy   2. Congenital spondylolysis   3. Radiculopathy due to lumbar intervertebral disc disorder     Plan: No additional findings.   Meds & Orders:  Meds ordered this encounter  Medications  . betamethasone acetate-betamethasone sodium phosphate (CELESTONE) injection 12 mg    Orders Placed This Encounter  Procedures  . XR C-ARM NO REPORT  . Epidural Steroid injection    Follow-up: Return in about 2 weeks (around 05/12/2018) for Daniel DelErin Zamora, FNP.   Procedures: No procedures performed  Lumbosacral Transforaminal Epidural Steroid Injection - Sub-Pedicular Approach with Fluoroscopic Guidance  [name]      Date of Birth: [birth year] MRN: [id number] PCP:  Lizbeth BarkHairston, Mandesia R, FNP      Visit Date: 04/28/2018   Universal Protocol:    Date/Time: 04/28/2018  Consent Given By: the patient  Position: PRONE  Additional Comments: Vital signs were monitored before and after the procedure. Patient was prepped and draped in the usual sterile fashion. The correct patient, procedure, and site was verified.   Injection Procedure Details:  Procedure Site One Meds Administered:  Meds ordered this encounter  Medications  . betamethasone acetate-betamethasone sodium phosphate (CELESTONE) injection 12 mg    Laterality: Right  Location/Site: Lumbarized S1 segment L5-S1  Needle size: 22 G  Needle type: Spinal  Needle Placement: Transforaminal  Findings:    -Comments: Excellent flow of contrast along the nerve and into the epidural space.  Procedure Details: After squaring off the end-plates to get a true AP view, the C-arm was positioned so that an oblique view of the foramen as noted above was visualized. The target area is just inferior to the "nose of the scotty dog" or sub pedicular. The soft tissues overlying this structure were infiltrated with 2-3 ml. of 1% Lidocaine without Epinephrine.  The spinal needle was inserted toward the target using a "trajectory" view along the fluoroscope beam.  Under AP and lateral visualization, the needle was advanced so it did not puncture dura and was located close the 6 O'Clock position of the pedical in AP tracterory. Biplanar projections were used to confirm position. Aspiration was confirmed to be negative for CSF and/or blood. A 1-2 ml. volume of Isovue-250 was injected and  flow of contrast was noted at each level. Radiographs were obtained for documentation purposes.   After attaining the desired flow of contrast documented above, a 0.5 to 1.0 ml test dose of 0.25% Marcaine was injected into each respective transforaminal space.  The patient was observed for 90 seconds post injection.  After no  sensory deficits were reported, and normal lower extremity motor function was noted,   the above injectate was administered so that equal amounts of the injectate were placed at each foramen (level) into the transforaminal epidural space.   Additional Comments:  The patient tolerated the procedure well Dressing: Band-Aid    Post-procedure details: Patient was observed during the procedure. Post-procedure instructions were reviewed.  Patient left the clinic in stable condition.    Clinical History: MRI LUMBAR SPINE WITHOUT CONTRAST  TECHNIQUE: Multiplanar, multisequence MR imaging of the lumbar spine was performed. No intravenous contrast was administered.  COMPARISON:  None.  FINDINGS: SEGMENTATION: For the purposes of this report, the last well-formed intervertebral disc will be reported as L5-S1. Transitional anatomy with partially lumbarized S1 vertebral body and rudimentary S1-2 disc.  ALIGNMENT: Maintained lumbar lordosis. Grade 1 L4-5 retrolisthesis and grade 1 L5-S1 anterolisthesis.  VERTEBRAE:Vertebral bodies are intact. Bilateral chronic L5 pars interarticularis defects. Moderate L5-S1 disc height loss. Disc desiccation L4-5 and L5-S1 compatible with degenerative discs. Mild subacute to chronic discogenic endplate changes L3-4 and L5-S1. Scattered old Schmorl's nodes. No suspicious or acute bone marrow signal. 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  CONUS MEDULLARIS: Conus medullaris terminates at L1-2 and demonstrates normal morphology and signal characteristics. Cauda equina is normal.  PARASPINAL AND SOFT TISSUES: Included prevertebral and paraspinal soft tissues are normal.  DISC LEVELS:  L1-2 thru L3-4: No disc bulge, canal stenosis nor neural foraminal narrowing.  L4-5: Retrolisthesis. 4 mm central disc protrusion with annular fissure. No canal stenosis or neural foraminal narrowing.  L5-S1: Anterolisthesis.  4 mm broad-based disc bulge annular fissure. No canal stenosis. Severe bilateral neural foraminal narrowing. T2 bright signal within the exited bilateral L5 nerves.  IMPRESSION: 1. Grade 1 L5-S1 anterolisthesis on the basis of chronic bilateral L5 pars interarticularis defects. Grade 1 L4-5 retrolisthesis. 2. No canal stenosis. Severe bilateral L5 neural foraminal narrowing with bilateral L5 neuritis.   Electronically Signed   By: Awilda Metro M.D.   On: 06/16/2017 04:16   He reports that he has never smoked. He has never used smokeless tobacco.  Recent Labs    10/07/17 1524  LABURIC 6.2    Objective:  VS:  HT:    WT:   BMI:     BP:(!) 146/84  HR:75bpm  TEMP: ( )  RESP:  Physical Exam  Ortho Exam Imaging: No results found.  Past Medical/Family/Surgical/Social History: Medications & Allergies reviewed per EMR, new medications updated. Patient Active Problem List   Diagnosis Date Noted  . Idiopathic chronic gout, unspecified site, without tophus (tophi) 04/14/2017  . Arthralgia of right ankle 02/09/2017  . Right leg pain 01/11/2017   History reviewed. No pertinent past medical history. Family History  Problem Relation Age of Onset  . Hypertension Mother   . Cancer Brother    Past Surgical History:  Procedure Laterality Date  . APPENDECTOMY     Social History   Occupational History  . Not on file  Tobacco Use  . Smoking status: Never Smoker  . Smokeless tobacco: Never Used  Substance and Sexual Activity  . Alcohol use: Yes    Comment: occ  .  Drug use: Not on file  . Sexual activity: Not on file

## 2018-05-12 NOTE — Procedures (Signed)
Lumbosacral Transforaminal Epidural Steroid Injection - Sub-Pedicular Approach with Fluoroscopic Guidance  [name]      Date of Birth: 05-15-1986 MRN: [id number] PCP: Lizbeth BarkHairston, Mandesia R, FNP      Visit Date: 04/28/2018   Universal Protocol:    Date/Time: 04/28/2018  Consent Given By: the patient  Position: PRONE  Additional Comments: Vital signs were monitored before and after the procedure. Patient was prepped and draped in the usual sterile fashion. The correct patient, procedure, and site was verified.   Injection Procedure Details:  Procedure Site One Meds Administered:  Meds ordered this encounter  Medications  . betamethasone acetate-betamethasone sodium phosphate (CELESTONE) injection 12 mg    Laterality: Right  Location/Site: Lumbarized S1 segment L5-S1  Needle size: 22 G  Needle type: Spinal  Needle Placement: Transforaminal  Findings:    -Comments: Excellent flow of contrast along the nerve and into the epidural space.  Procedure Details: After squaring off the end-plates to get a true AP view, the C-arm was positioned so that an oblique view of the foramen as noted above was visualized. The target area is just inferior to the "nose of the scotty dog" or sub pedicular. The soft tissues overlying this structure were infiltrated with 2-3 ml. of 1% Lidocaine without Epinephrine.  The spinal needle was inserted toward the target using a "trajectory" view along the fluoroscope beam.  Under AP and lateral visualization, the needle was advanced so it did not puncture dura and was located close the 6 O'Clock position of the pedical in AP tracterory. Biplanar projections were used to confirm position. Aspiration was confirmed to be negative for CSF and/or blood. A 1-2 ml. volume of Isovue-250 was injected and flow of contrast was noted at each level. Radiographs were obtained for documentation purposes.   After attaining the desired flow of  contrast documented above, a 0.5 to 1.0 ml test dose of 0.25% Marcaine was injected into each respective transforaminal space.  The patient was observed for 90 seconds post injection.  After no sensory deficits were reported, and normal lower extremity motor function was noted,   the above injectate was administered so that equal amounts of the injectate were placed at each foramen (level) into the transforaminal epidural space.   Additional Comments:  The patient tolerated the procedure well Dressing: Band-Aid    Post-procedure details: Patient was observed during the procedure. Post-procedure instructions were reviewed.  Patient left the clinic in stable condition.

## 2018-06-10 ENCOUNTER — Telehealth (INDEPENDENT_AMBULATORY_CARE_PROVIDER_SITE_OTHER): Payer: Self-pay | Admitting: Orthopedic Surgery

## 2018-06-10 NOTE — Telephone Encounter (Signed)
Patients girlfriend requesting refill on tramadol. He uses [facility] Patients # [phone]

## 2018-06-13 ENCOUNTER — Other Ambulatory Visit (INDEPENDENT_AMBULATORY_CARE_PROVIDER_SITE_OTHER): Payer: Self-pay

## 2018-06-13 MED ORDER — TRAMADOL HCL 50 MG PO TABS: 50.0000 mg | ORAL_TABLET | Freq: Two times a day (BID) | ORAL | 0 refills | Status: DC

## 2018-06-13 NOTE — Telephone Encounter (Signed)
Pt has had ESI L spine with FN. He is requesting a refill on tramadol please advise

## 2018-06-13 NOTE — Telephone Encounter (Signed)
Refill tramadol

## 2018-06-13 NOTE — Telephone Encounter (Signed)
Called rx into pharm and called pt to advise. To call with questions.

## 2018-06-14 MED FILL — traMADol HCL 50 MG TABS: 50 | 20 days supply | Qty: 40 | Fill #0

## 2018-06-29 IMAGING — MR MR ANKLE*R* W/O CM
4 of 6 series · 19 of 40 positions shown · non-contrast
Comparison: None.

CLINICAL DATA: Right ankle pain.

EXAM:
MRI OF THE RIGHT ANKLE WITHOUT CONTRAST
TECHNIQUE: Multiplanar, multisequence MR imaging of the ankle was performed. No
intravenous contrast was administered.

[Series 6: PD fat-sat · axial · 3.0mm · 0.33mm/px · z∈[-95,+24]mm · 8 of 36 slices shown]
[im 1/36]
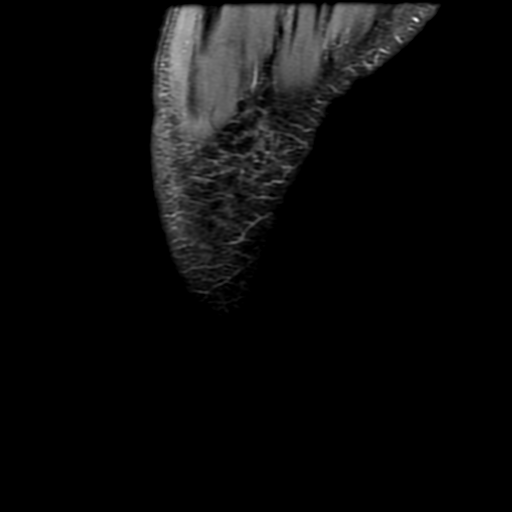
[im 6/36]
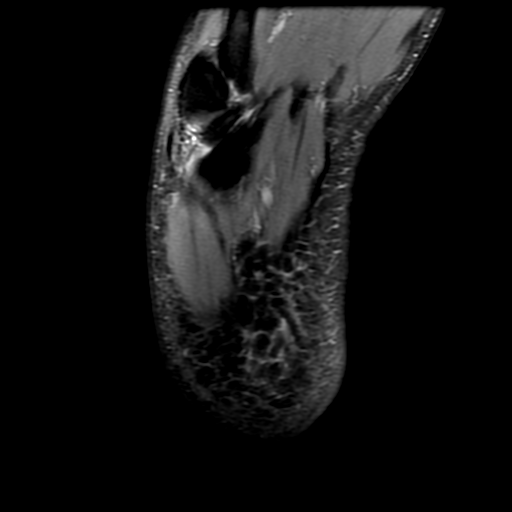
[im 11/36]
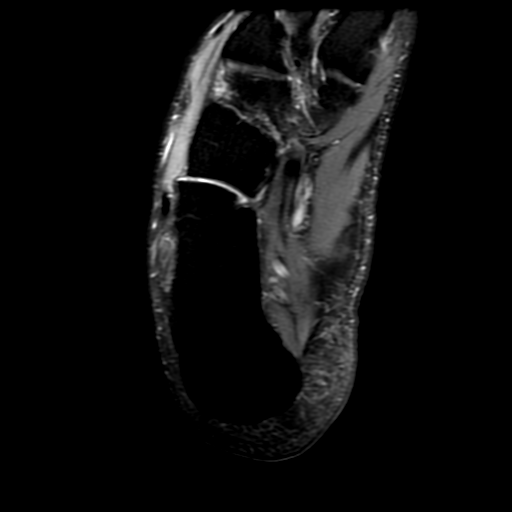
[im 16/36]
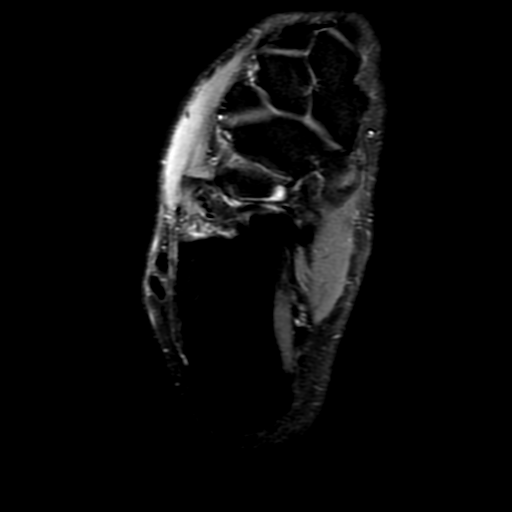
[im 21/36]
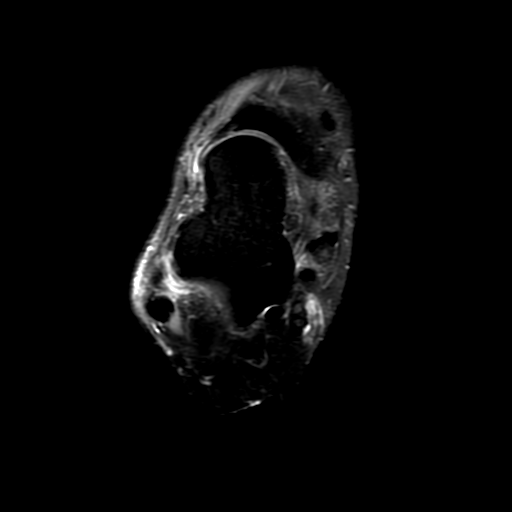
[im 26/36]
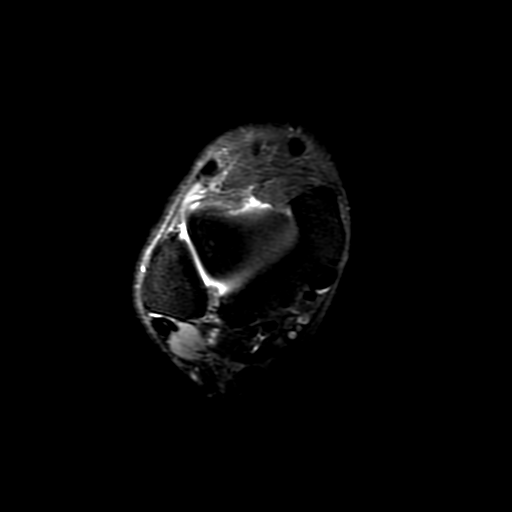
[im 31/36]
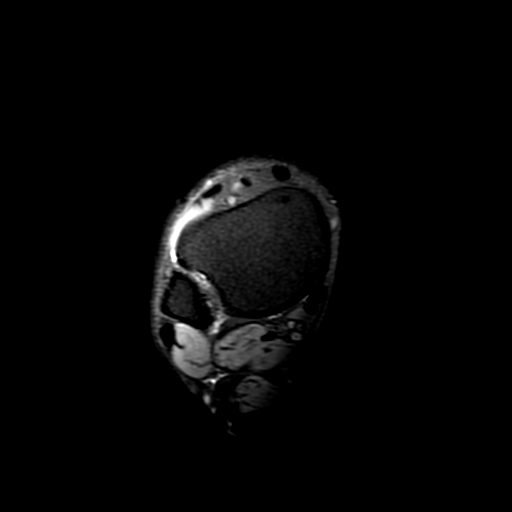
[im 36/36]
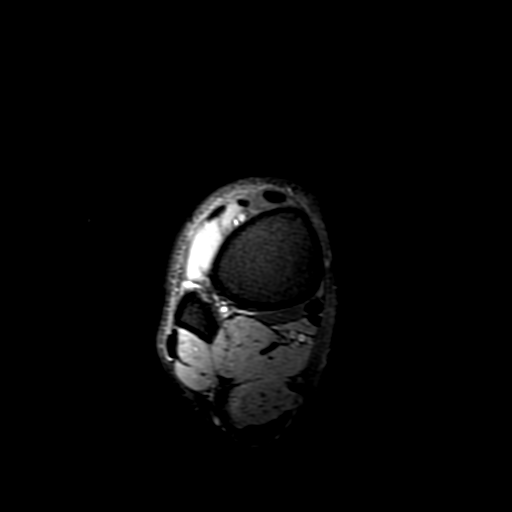

[Series 7: T2 fat-sat · axial · 3.0mm · 0.33mm/px · z∈[-78,+7]mm · 3 of 36 slices shown (1 of 2)]
[im 6/36]
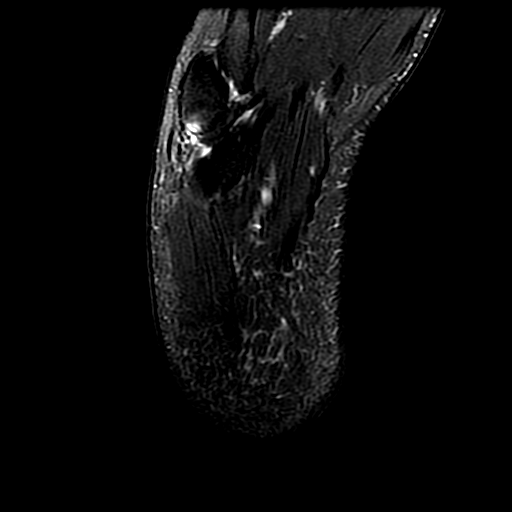
[im 21/36]
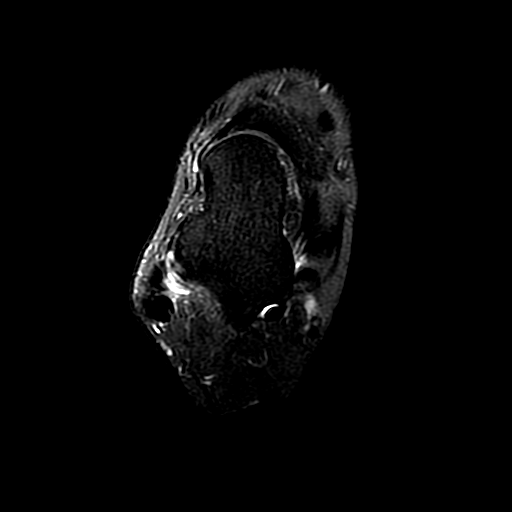
[im 31/36]
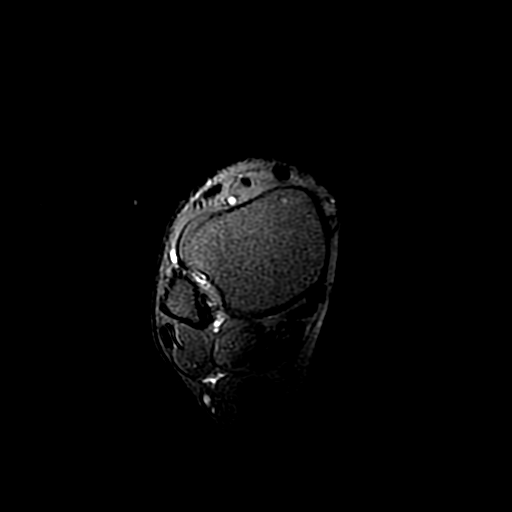

[Series 8: PD · axial · 3.0mm · 0.33mm/px · z∈[-95,+7]mm · 5 of 36 slices shown]
[im 1/36]
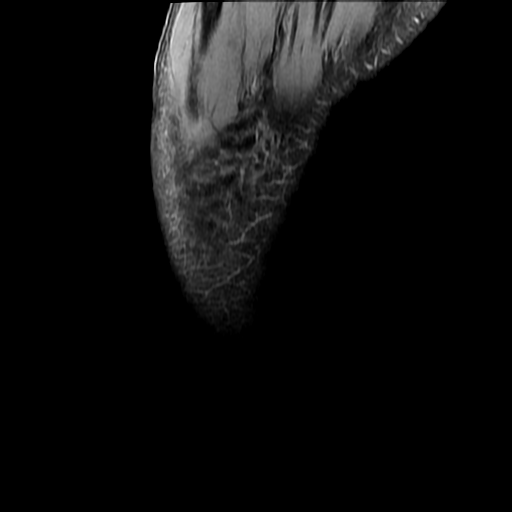
[im 6/36]
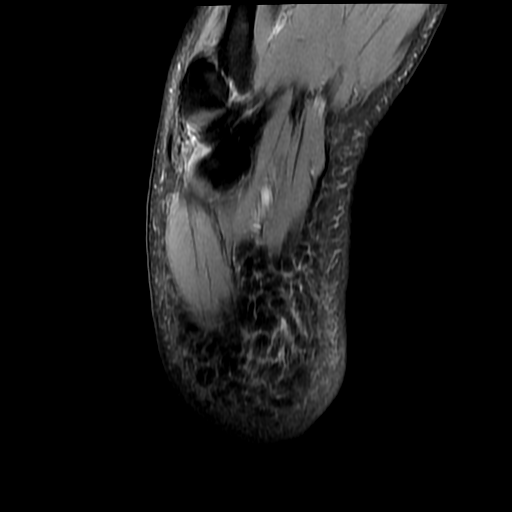
[im 11/36]
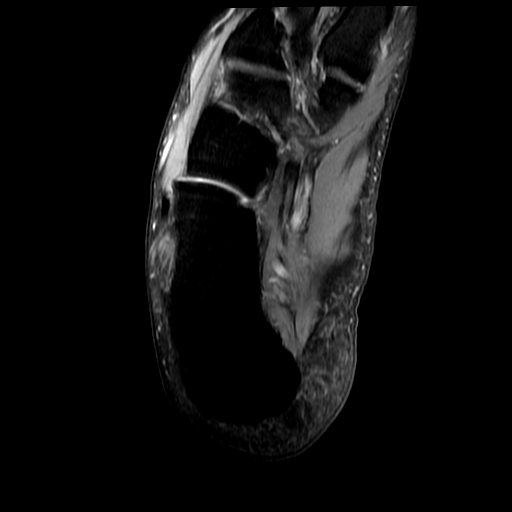
[im 21/36]
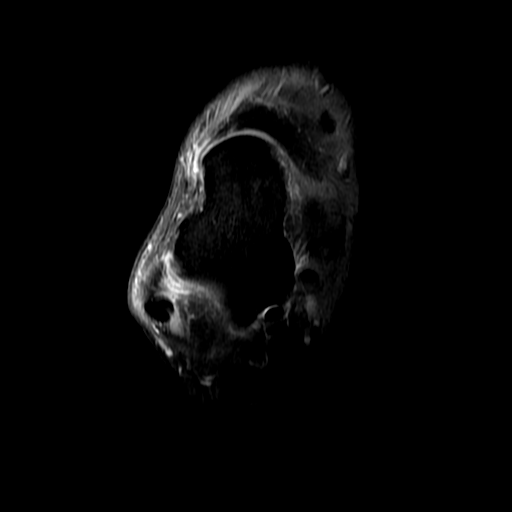
[im 31/36]
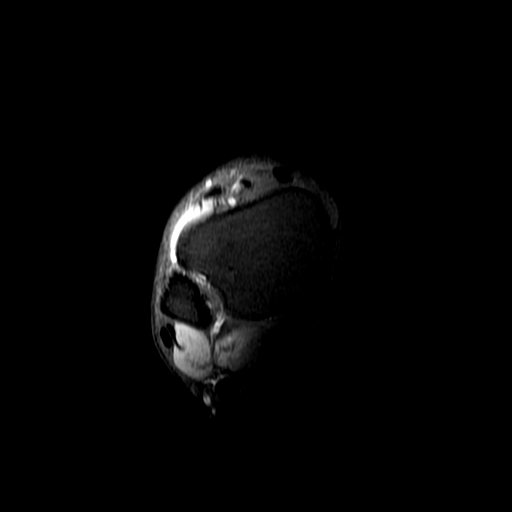

[Series 9: T2 fat-sat · coronal · 3.0mm · 0.27mm/px · 3 of 40 slices shown (2 of 2)]
[im 6/40]
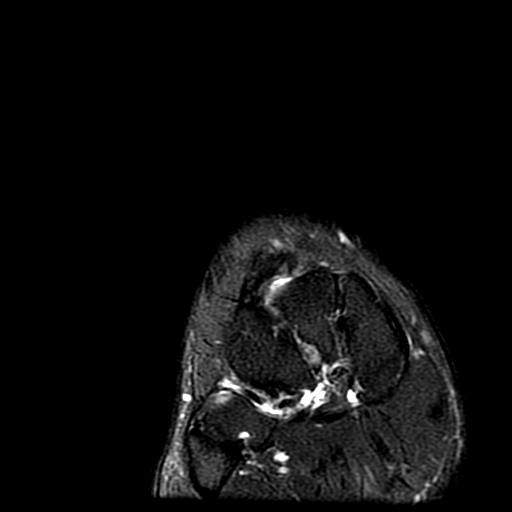
[im 23/40]
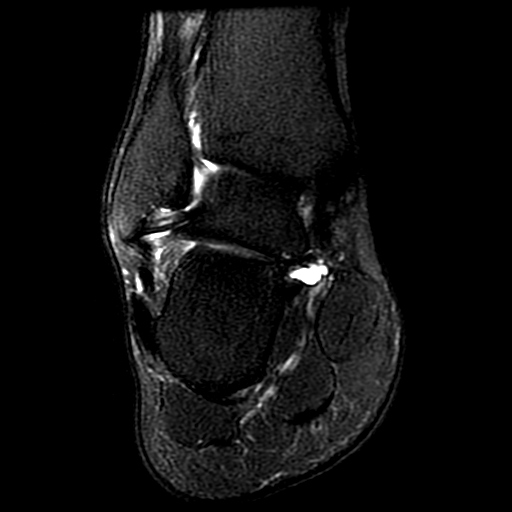
[im 34/40]
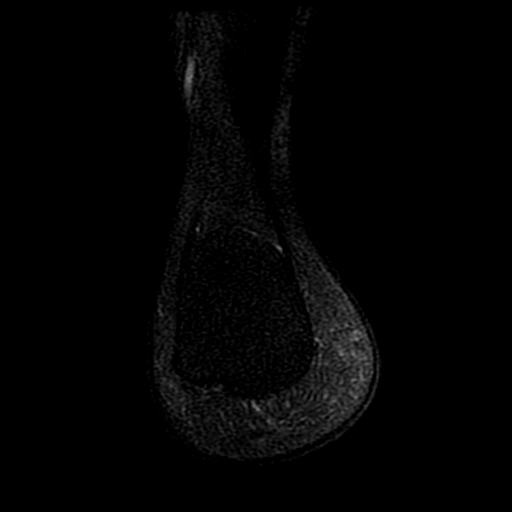

[19 of 40 positions shown; findings below may reference images not displayed]

FINDINGS: TENDONS

Peroneal: Peroneal longus tendon intact. Peroneal brevis intact.

Posteromedial: Posterior tibial tendon intact. Flexor hallucis
longus tendon intact. Flexor digitorum longus tendon intact.

Anterior: Tibialis anterior tendon intact. Extensor hallucis longus
tendon intact Extensor digitorum longus tendon intact.

Achilles:  Intact.

Plantar Fascia: Intact.

LIGAMENTS

Lateral: Anterior talofibular ligament intact. Calcaneofibular
ligament intact. Posterior talofibular ligament intact. Anterior and
posterior tibiofibular ligaments intact.

Medial: Deltoid ligament intact. Spring ligament intact.

CARTILAGE

Ankle Joint: No joint effusion. Normal ankle mortise. No chondral
defect.

Subtalar Joints/Sinus Tarsi: Normal subtalar joints. No subtalar
joint effusion. Normal sinus tarsi.

Bones: Mild osteoarthritis of the talonavicular joint. Minimal
osteoarthritis of the fourth tarsometatarsal joint.

Soft Tissue: 11 x 7 mm multiloculated cystic structure arising from
the middle subtalar joint likely reflecting a small ganglion cyst
adjacent to the tarsal tunnel and may result in tibial nerve
impingement.
IMPRESSION: 1. No acute injury of the right ankle.
2. 11 x 7 mm multiloculated cystic structure arising from the middle
subtalar joint likely reflecting a small ganglion cyst adjacent to
the tarsal tunnel and may result in tibial nerve impingement.

## 2018-07-18 ENCOUNTER — Telehealth (INDEPENDENT_AMBULATORY_CARE_PROVIDER_SITE_OTHER): Payer: Self-pay | Admitting: Orthopedic Surgery

## 2018-07-18 ENCOUNTER — Telehealth (INDEPENDENT_AMBULATORY_CARE_PROVIDER_SITE_OTHER): Payer: Self-pay | Admitting: *Deleted

## 2018-07-18 NOTE — Telephone Encounter (Signed)
Called [phone] and vm was not set up.

## 2018-07-18 NOTE — Telephone Encounter (Signed)
Patient's daughter Jerrol Banana) called advised patient need Rx refill (Tramadol) The number to contact Victorino Dike is [phone]

## 2018-07-18 NOTE — Telephone Encounter (Signed)
rx request 

## 2018-07-19 NOTE — Telephone Encounter (Signed)
Please Advise

## 2018-07-19 NOTE — Telephone Encounter (Signed)
Ok refill tramedol 

## 2018-07-20 ENCOUNTER — Other Ambulatory Visit (INDEPENDENT_AMBULATORY_CARE_PROVIDER_SITE_OTHER): Payer: Self-pay

## 2018-07-20 MED ORDER — TRAMADOL HCL 50 MG PO TABS: 50.0000 mg | ORAL_TABLET | Freq: Two times a day (BID) | ORAL | 0 refills | Status: DC

## 2018-07-20 NOTE — Telephone Encounter (Signed)
Called to advise rx called into the pharm. Advised that last 2 injections with FN have not worked and is wanting to discuss any further treatment options for back pain. Do you want to refer him to Dr. Otelia Sergeant or Dr. Ophelia Charter? He is under the impression that surgical intervention may be an option.

## 2018-07-20 NOTE — Telephone Encounter (Signed)
Can you call pt and make appt for this pt to see Dr. Ophelia Charter new pt eval for L spine?

## 2018-07-20 NOTE — Telephone Encounter (Signed)
Refer to [facility] for evaluation

## 2018-07-21 ENCOUNTER — Other Ambulatory Visit (INDEPENDENT_AMBULATORY_CARE_PROVIDER_SITE_OTHER): Payer: Self-pay

## 2018-07-21 MED ORDER — TRAMADOL HCL 50 MG PO TABS: 50.0000 mg | ORAL_TABLET | Freq: Two times a day (BID) | ORAL | 0 refills | Status: DC

## 2018-07-21 MED FILL — traMADol HCL 50 MG TABS: 50 | 20 days supply | Qty: 40 | Fill #0

## 2018-07-21 NOTE — Telephone Encounter (Signed)
done

## 2018-07-21 NOTE — Telephone Encounter (Signed)
Pharmacy did not receive tramadol, daughter is here to pick up script

## 2018-08-09 ENCOUNTER — Ambulatory Visit (INDEPENDENT_AMBULATORY_CARE_PROVIDER_SITE_OTHER): Payer: Self-pay | Admitting: Orthopaedic Surgery

## 2018-08-09 ENCOUNTER — Encounter (INDEPENDENT_AMBULATORY_CARE_PROVIDER_SITE_OTHER): Payer: Self-pay | Admitting: Orthopaedic Surgery

## 2018-08-09 VITALS — BP 132/87 | HR 55 | Ht 70.0 in | Wt 220.0 lb

## 2018-08-09 DIAGNOSIS — M48061 Spinal stenosis, lumbar region without neurogenic claudication: Secondary | ICD-10-CM

## 2018-08-09 DIAGNOSIS — M9983 Other biomechanical lesions of lumbar region: Secondary | ICD-10-CM

## 2018-08-12 NOTE — Progress Notes (Signed)
Office Visit Note   [name]           Date of Birth: 25-Sep-1986           MRN: [id number] Visit Date: 08/09/2018              Requested by: Lizbeth Bark, FNP No address on file PCP: Lizbeth Bark, FNP   Assessment & Plan: Visit Diagnoses:  1. Neuroforaminal stenosis of lumbar spine     Plan: Patient has retrolisthesis at L4-5 with disc dehydration and disc protrusion.  His anterolisthesis at L5-S1 with chronic pars defect and radiculopathy with positive EMGs nerve conduction velocities.  He is had epidurals.  We discussed the need to be taking his treatment for gout.  He has not responded to epidural injections and treatment would require instrumented lumbar fusion.  We discussed likely to night stay in the hospital, use of a brace instrumented fusion potential for nonunion, reoperation, pseudoarthrosis, dural tear and repair.  Potential for development of problems at adjacent levels.  He can call if he like to proceed.  MRI scan was reviewed with patient as well as report.  Follow-Up Instructions: No follow-ups on file.   Orders:  No orders of the defined types were placed in this encounter.  No orders of the defined types were placed in this encounter.     Procedures: No procedures performed   Clinical Data: No additional findings.   Subjective: Chief Complaint  Patient presents with  . Lower Back - Pain    HPI 32 year old male Hispanic with chronic low back pain.  MRI scan shows by foraminal stenosis with anterolisthesis with chronic bilateral pars defects.  At L4-5 he had grade 1 retrolisthesis with annular fissure disc protrusion and disc dehydration.  He has gout but currently is not on allopurinol.  He is been using tramadol for pain.  Epidurals x2 gave him only temporary relief.  He states he is having trouble working due to the back pain.  He denies bowel or bladder symptoms.  He has increased pain with bending, turning,  twisting, and lifting which is what he does at work.  Patient's had epidurals 12/27/2017 and 04/28/2018 with some improvement.  He had a course of prednisone prior to that.  Uric acid 03/18/2017 was 10.1.  After allopurinol it dropped to 3.20-month later and in December 2018 uric acid was 6.2.  EMGs nerve conduction velocities 06/08/2017 showed moderate chronic L5 radiculopathy on the right.  Review of Systems 14 point review of systems positive for gout without tophi.  Chronic low back pain.  L5 pars defect with listhesis.  Retrolisthesis of 4-5.   Objective: Vital Signs: BP 132/87   Pulse (!) 55   Ht 5\' 10"  (1.778 m)   Wt 220 lb (99.8 kg)   BMI 31.57 kg/m   Physical Exam  Constitutional: He is oriented to person, place, and time. He appears well-developed and well-nourished.  HENT:  Head: Normocephalic and atraumatic.  Eyes: Pupils are equal, round, and reactive to light. EOM are normal.  Neck: No tracheal deviation present. No thyromegaly present.  Cardiovascular: Normal rate.  Pulmonary/Chest: Effort normal. He has no wheezes.  Abdominal: Soft. Bowel sounds are normal.  Neurological: He is alert and oriented to person, place, and time.  Skin: Skin is warm and dry. Capillary refill takes less than 2 seconds.  Psychiatric: He has a normal mood and affect. His behavior is normal. Judgment and thought content normal.  Ortho Exam patient has bilateral tight hamstrings.  He has pain with straight leg raising 80 degrees right and left.  Knee and ankle jerk are intact.  He has pain but is able to take several steps with heel and toe walking.  Specialty Comments:  No specialty comments available.  Imaging:CLINICAL DATA:  Radiculopathy for greater than 6 weeks, failed conservative treatment.  EXAM: MRI LUMBAR SPINE WITHOUT CONTRAST  TECHNIQUE: Multiplanar, multisequence MR imaging of the lumbar spine was performed. No intravenous contrast was administered.  COMPARISON:   None.  FINDINGS: SEGMENTATION: For the purposes of this report, the last well-formed intervertebral disc will be reported as L5-S1. Transitional anatomy with partially lumbarized S1 vertebral body and rudimentary S1-2 disc.  ALIGNMENT: Maintained lumbar lordosis. Grade 1 L4-5 retrolisthesis and grade 1 L5-S1 anterolisthesis.  VERTEBRAE:Vertebral bodies are intact. Bilateral chronic L5 pars interarticularis defects. Moderate L5-S1 disc height loss. Disc desiccation L4-5 and L5-S1 compatible with degenerative discs. Mild subacute to chronic discogenic endplate changes L3-4 and L5-S1. Scattered old Schmorl's nodes. No suspicious or acute bone marrow signal. 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  CONUS MEDULLARIS: Conus medullaris terminates at L1-2 and demonstrates normal morphology and signal characteristics. Cauda equina is normal.  PARASPINAL AND SOFT TISSUES: Included prevertebral and paraspinal soft tissues are normal.  DISC LEVELS:  L1-2 thru L3-4: No disc bulge, canal stenosis nor neural foraminal narrowing.  L4-5: Retrolisthesis. 4 mm central disc protrusion with annular fissure. No canal stenosis or neural foraminal narrowing.  L5-S1: Anterolisthesis. 4 mm broad-based disc bulge annular fissure. No canal stenosis. Severe bilateral neural foraminal narrowing. T2 bright signal within the exited bilateral L5 nerves.  IMPRESSION: 1. Grade 1 L5-S1 anterolisthesis on the basis of chronic bilateral L5 pars interarticularis defects. Grade 1 L4-5 retrolisthesis. 2. No canal stenosis. Severe bilateral L5 neural foraminal narrowing with bilateral L5 neuritis.   Electronically Signed   By: Awilda Metro M.D.   On: 06/16/2017 04:16    PMFS History: Patient Active Problem List   Diagnosis Date Noted  . Idiopathic chronic gout, unspecified site, without tophus (tophi) 04/14/2017  . Arthralgia of right ankle 02/09/2017  .  Right leg pain 01/11/2017   No past medical history on file.  Family History  Problem Relation Age of Onset  . Hypertension Mother   . Cancer Brother     Past Surgical History:  Procedure Laterality Date  . APPENDECTOMY     Social History   Occupational History  . Not on file  Tobacco Use  . Smoking status: Never Smoker  . Smokeless tobacco: Never Used  Substance and Sexual Activity  . Alcohol use: Yes    Comment: occ  . Drug use: Not on file  . Sexual activity: Not on file

## 2018-08-18 ENCOUNTER — Ambulatory Visit (INDEPENDENT_AMBULATORY_CARE_PROVIDER_SITE_OTHER): Payer: Self-pay | Admitting: Physician Assistant

## 2018-08-18 DIAGNOSIS — M1A0710 Idiopathic chronic gout, right ankle and foot, without tophus (tophi): Secondary | ICD-10-CM

## 2018-08-19 ENCOUNTER — Encounter (INDEPENDENT_AMBULATORY_CARE_PROVIDER_SITE_OTHER): Payer: Self-pay

## 2018-08-19 ENCOUNTER — Ambulatory Visit (INDEPENDENT_AMBULATORY_CARE_PROVIDER_SITE_OTHER): Payer: Self-pay | Admitting: Physician Assistant

## 2018-08-19 ENCOUNTER — Encounter (INDEPENDENT_AMBULATORY_CARE_PROVIDER_SITE_OTHER): Payer: Self-pay | Admitting: Physician Assistant

## 2018-08-19 DIAGNOSIS — M1A0710 Idiopathic chronic gout, right ankle and foot, without tophus (tophi): Secondary | ICD-10-CM

## 2018-08-19 NOTE — Progress Notes (Addendum)
Patient was here to have labs drawn for Uric Acid.

## 2018-08-19 NOTE — Progress Notes (Signed)
Office Visit Note   [name]           Date of Birth: [birth year]           MRN: [id number] Visit Date: 08/18/2018              Requested by: Lizbeth Bark, FNP No address on file PCP: Lizbeth Bark, FNP  No chief complaint on file.     HPI: The patient is a 32 year old Hispanic male who presents with a Engineer, structural for follow-up of a history of gout with painful sensations in both feet and toes.  He had previously been on allopurinol and colchicine in the past for gout but reports that he discontinued these because he was doing better.  His last uric acid level in December 2018 was 6.2.  The initial level in May 2018 was 10.1 and he progressively improved with regards to his level following the initiation of allopurinol.  He is recently been seen for his back and does have evidence of retrolisthesis at L4-5 with disc dehydration and disc protrusion and had not responded well to conservative treatment and lumbar fusion was recommended.  He request a follow-up uric acid level to make sure that this is not contributing to any of his symptoms and his feet and toes.  He particularly reports pain about both of his heels and rates it as 8 out of 10 at its most severe.  Assessment & Plan: Visit Diagnoses:  1. Idiopathic chronic gout of right ankle without tophus     Plan: Uric acid level was drawn and we will call the patient with the results and any recommendations.  Follow-Up Instructions: Return if symptoms worsen or fail to improve.   Ortho Exam  Patient is alert, oriented, no adenopathy, well-dressed, normal affect, normal respiratory effort. The patient is a well-nourished well-developed male who ambulates without an assistive device.  Examination of bilateral feet and ankle shows good range of motion feet are plantigrade, no instability. No signs of infection or cellulitis.  Good pedal pulses bilaterally. Imaging: No results  found. No images are attached to the encounter.  Labs: Lab Results  Component Value Date   HGBA1C 5.5 09/07/2014   LABURIC 6.2 10/07/2017   LABURIC 3.6 (L) 04/14/2017   LABURIC 10.1 (H) 03/18/2017     Lab Results  Component Value Date   ALBUMIN 4.8 09/07/2014   LABURIC 6.2 10/07/2017   LABURIC 3.6 (L) 04/14/2017   LABURIC 10.1 (H) 03/18/2017    There is no height or weight on file to calculate BMI.  Orders:  No orders of the defined types were placed in this encounter.  No orders of the defined types were placed in this encounter.    Procedures: No procedures performed  Clinical Data: No additional findings.  ROS:  All other systems negative, except as noted in the HPI. Review of Systems  Objective: Vital Signs: There were no vitals taken for this visit.  Specialty Comments:  No specialty comments available.  PMFS History: Patient Active Problem List   Diagnosis Date Noted  . Idiopathic chronic gout, unspecified site, without tophus (tophi) 04/14/2017  . Arthralgia of right ankle 02/09/2017  . Right leg pain 01/11/2017   History reviewed. No pertinent past medical history.  Family History  Problem Relation Age of Onset  . Hypertension Mother   . Cancer Brother     Past Surgical History:  Procedure Laterality Date  . APPENDECTOMY     Social  History   Occupational History  . Not on file  Tobacco Use  . Smoking status: Never Smoker  . Smokeless tobacco: Never Used  Substance and Sexual Activity  . Alcohol use: Yes    Comment: occ  . Drug use: Not on file  . Sexual activity: Not on file

## 2018-08-20 LAB — URIC ACID: Uric Acid, Serum: 8.4 mg/dL — ABNORMAL HIGH (ref 4.0–8.0)

## 2018-08-22 ENCOUNTER — Telehealth (INDEPENDENT_AMBULATORY_CARE_PROVIDER_SITE_OTHER): Payer: Self-pay | Admitting: Physician Assistant

## 2018-08-22 NOTE — Telephone Encounter (Signed)
-----   Message from Nadara Mustard, MD sent at 08/22/2018 12:44 PM EST ----- Call in allopurinol 100 bid for gout

## 2018-08-24 NOTE — Telephone Encounter (Signed)
Patient's Wife left a message in regards to the patient's medication for his gout.  She would like to know if the medication has been called in.  CB#[phone]

## 2018-08-25 ENCOUNTER — Other Ambulatory Visit (INDEPENDENT_AMBULATORY_CARE_PROVIDER_SITE_OTHER): Payer: Self-pay | Admitting: Physician Assistant

## 2018-08-25 MED ORDER — ALLOPURINOL 100 MG PO TABS: 100.0000 mg | ORAL_TABLET | Freq: Two times a day (BID) | ORAL | 3 refills | Status: DC

## 2018-08-25 MED FILL — ALLOPURINOL 100 MG TABLET: 100 | 30 days supply | Qty: 60 | Fill #0

## 2018-08-25 NOTE — Telephone Encounter (Signed)
Allopurinol 100 mg BID sent to pharmacy.

## 2018-08-25 NOTE — Telephone Encounter (Signed)
I called and advised that rx has been faxed to pharm. She asked if she could receive a call back next week when liz is in the office to explain in further detail.

## 2018-08-29 ENCOUNTER — Telehealth (INDEPENDENT_AMBULATORY_CARE_PROVIDER_SITE_OTHER): Payer: Self-pay

## 2018-08-29 NOTE — Telephone Encounter (Signed)
Sorry.  He has had at least 20 prescriptions for tramadol over the last 2 years.  Continuing to take narcotic pain medication is not the appropriate treatment for his condition.  He can use Aleve or ibuprofen plus Tylenol.

## 2018-08-29 NOTE — Telephone Encounter (Signed)
It was just one rx for the allopurinol and then can come back in a few weeks to recheck labs.

## 2018-08-29 NOTE — Telephone Encounter (Signed)
Can you please call patient and advise?  Thank you.

## 2018-08-29 NOTE — Telephone Encounter (Signed)
AUTUMN:  Called Patient he was unavailable and spoke to  Wife. She is aware. She would like to know when patient would have to comeback for a uric acid recheck (would like to go ahead and make appt). Also would like to know if its only 1 Rx that patient needs to take or is it 2. She thought Dr had said 2 prescriptions?

## 2018-08-29 NOTE — Telephone Encounter (Signed)
Patient states he is still having back pain and would like a RF on Tramadol. He states he cannot have Back SU right now.   CB [phone]    (You can send it back to me once Dr approves/denies Rx, he only speaks spanish and I can call and advise.)

## 2018-08-29 NOTE — Telephone Encounter (Signed)
Please advise 

## 2018-08-29 NOTE — Telephone Encounter (Signed)
Can you please call pt? I called last week and they asked if you would call back to interpret for him.  Advise that his uric acid level is elevated and he is aware that we sent rx to the pharm for allopurinol. Can you please explain that this is something that he will need to take from her on out. Its  Medication to maintain his uric acid level.  and that if he should stop taking it his uric acid level will creep back up. We will recheck labs at his next office visit.

## 2018-08-30 NOTE — Telephone Encounter (Signed)
Called patient to advise. Made appt for in 4 weeks from now.

## 2018-08-30 NOTE — Telephone Encounter (Signed)
See message below.  I believe this is for you.

## 2018-08-30 NOTE — Telephone Encounter (Signed)
Called patient to advise  °

## 2018-09-22 ENCOUNTER — Ambulatory Visit: Payer: Self-pay

## 2018-09-22 ENCOUNTER — Telehealth (INDEPENDENT_AMBULATORY_CARE_PROVIDER_SITE_OTHER): Payer: Self-pay | Admitting: Orthopedic Surgery

## 2018-09-22 ENCOUNTER — Other Ambulatory Visit (INDEPENDENT_AMBULATORY_CARE_PROVIDER_SITE_OTHER): Payer: Self-pay

## 2018-09-22 MED ORDER — ALLOPURINOL 100 MG PO TABS: 100.0000 mg | ORAL_TABLET | Freq: Two times a day (BID) | ORAL | 3 refills | Status: AC

## 2018-09-22 MED ORDER — COLCHICINE 0.6 MG PO TABS: 0.6000 mg | ORAL_TABLET | Freq: Every day | ORAL | 12 refills | Status: DC

## 2018-09-22 NOTE — Telephone Encounter (Signed)
Pharmacy  [facility]    Medication refill  Gout medicine unaware of name    Patient cancel and rescheduled appointment due to financial  letter expiring will come in January

## 2018-09-22 NOTE — Telephone Encounter (Signed)
rx was faxed to pharm per pt request for allopurinol and colchicine.

## 2018-09-26 ENCOUNTER — Telehealth: Payer: Self-pay | Admitting: Family Medicine

## 2018-09-26 NOTE — Telephone Encounter (Signed)
Pt wife LVM to call back, since her husband need to cancel the financial appt since he wont, be in town, I called them back an cancel the appt

## 2018-09-27 ENCOUNTER — Ambulatory Visit (INDEPENDENT_AMBULATORY_CARE_PROVIDER_SITE_OTHER): Payer: Self-pay | Admitting: Orthopedic Surgery

## 2018-09-28 ENCOUNTER — Ambulatory Visit: Payer: Self-pay | Admitting: Nurse Practitioner

## 2018-09-29 ENCOUNTER — Ambulatory Visit: Payer: Self-pay

## 2018-10-10 MED FILL — ALLOPURINOL 100 MG TABLET: 100 | 30 days supply | Qty: 60 | Fill #0

## 2018-10-18 ENCOUNTER — Ambulatory Visit: Payer: Self-pay | Admitting: Nurse Practitioner

## 2018-10-20 ENCOUNTER — Ambulatory Visit (INDEPENDENT_AMBULATORY_CARE_PROVIDER_SITE_OTHER): Payer: Self-pay | Admitting: Orthopedic Surgery

## 2018-10-25 ENCOUNTER — Encounter: Payer: Self-pay | Admitting: Nurse Practitioner

## 2018-10-25 ENCOUNTER — Ambulatory Visit: Payer: Self-pay | Attending: Nurse Practitioner | Admitting: Nurse Practitioner

## 2018-10-25 VITALS — BP 121/77 | HR 66 | Temp 97.9°F | Ht 70.0 in | Wt 239.8 lb

## 2018-10-25 DIAGNOSIS — M549 Dorsalgia, unspecified: Secondary | ICD-10-CM | POA: Insufficient documentation

## 2018-10-25 DIAGNOSIS — Z79899 Other long term (current) drug therapy: Secondary | ICD-10-CM | POA: Insufficient documentation

## 2018-10-25 DIAGNOSIS — M1A0710 Idiopathic chronic gout, right ankle and foot, without tophus (tophi): Secondary | ICD-10-CM | POA: Insufficient documentation

## 2018-10-25 MED ORDER — COLCHICINE 0.6 MG PO TABS: 0.6000 mg | ORAL_TABLET | Freq: Every day | ORAL | 12 refills | Status: AC

## 2018-10-25 MED FILL — COLCHICINE 0.6 MG TABS: 0.6 | 10 days supply | Qty: 10 | Fill #0

## 2018-10-25 NOTE — Progress Notes (Signed)
Patient is also having pain in foot.

## 2018-10-25 NOTE — Patient Instructions (Signed)
Plan de alimentacin con bajo contenido de purinas Low-Purine Eating Plan Un plan de alimentacin con bajo contenido de purinas implica elegir alimentos para limitar la ingesta de purinas. Las purinas son un tipo de cido rico. Si hay demasiado cido rico en la sangre, pueden manifestarse algunas afecciones, como gota y clculos renales. Consumir alimentos con bajo contenido de purinas puede ayudar a Acupuncturist. Consejos para seguir Consulting civil engineer las etiquetas de los [employer]ogg alimentos que contienen grasas saturadas o trans.  Leer la lista de ingredientes de los alimentos a base de granos, por ejemplo, panes y cereales, para verificar si contienen cereales integrales.  Leer la lista de ingredientes de salsas o sopas para asegurarse de que no contengan carne ni pescado.  Cuando se escojan refrescos, leer la lista de ingredientes para asegurarse de que no contengan jarabe de maz de alta fructosa. De compras  Compre mucha fruta y verdura fresca.  No compre pescado fresco ni enlatado.  Compre lcteos con bajo contenido de [place] o sin grasa.  Evite comprar alimentos procesados o prehechos. A menudo, contienen mucha grasa, sal (sodio) y azcar agregada. Coccin  Para cocinar, use aceite de Location manager de mantequilla. Los [employer] aceite de [place], canola y girasol contienen grasas saludables. Planificacin de las comidas  Aprenda qu alimentos son adecuados para usted y cules son perjudiciales. Si descubre que un alimento tiende a causarle una exacerbacin de la gota, evite comerlo. Puede disfrutar de los alimentos que no le causan problemas. Si tiene [place] pregunta sobre un alimento especfico, hable con el nutricionista o el mdico.  Limite los alimentos que contengan mucha grasa, especialmente grasas saturadas. La grasa hace que el organismo tenga ms dificultad para eliminar el cido rico.  Elija alimentos con bajo contenido de grasa y  fuentes de protenas magras. Pautas generales  Limite el consumo de alcohol a no ms de por da si es mujer y no est [place], y por da si es hombre. Una medida equivale a 12oz ( ) de cerveza, 5oz ( ) de vino o 1oz (66ml) de bebidas alcohlicas de alta graduacin. El alcohol puede alterar el modo en que el organismo elimina el cido rico.  [place] mucha agua para mantener la orina clara o de color amarillo plido. El lquido puede ayudar a [place] cido rico del cuerpo.  Si el mdico lo indica, tome un suplemento de vitaminaC.  Junto con el mdico y el nutricionista, diseen un plan que le permita alcanzar un peso saludable o [employer]. Perder peso puede ayudar a reducir el cido rico en [employer]. Qu alimentos se recomiendan? Esta podra no ser [employer]. Hable con el nutricionista sobre las mejores opciones alimenticias para usted. Alimentos con bajo contenido de purinas No es Chief Operating Officer el consumo de los alimentos con bajo contenido de purinas. Estos incluyen los siguientes:  Todas las frutas.  Todas las verduras con bajo contenido de purinas, los pickles y las [place].  Panes, pastas, arroz, pan de maz y palomitas de maz. [place] y otros productos horneados.  Todos los lcteos.  Huevos, nueces y mantequillas de frutos secos.  Especias y condimentos, por ejemplo, sal, hierbas y vinagre.  Reece Levy y aceites vegetales.  Westley Hummer, refrescos sin azcar, t, caf y cacao.  Aderezos, salsas, caldos y sopas de verduras. Alimentos con contenido moderado de purinas Deben limitarse los alimentos con contenido moderado de purinas segn las cantidades indicadas.  Esprragos, coliflor, espinacas, championes y  guisantes (taza al da).  Avena cruda (2/3de taza al da).  Germen de trigo o salvado de trigo seco (de taza al da).  Carne de res o de ave (de 2a3onzas al da).  Mariscos, por ejemplo,  cangrejo, langosta, ostras o camarones (de 4a6onzas al da).  Frijoles, guisantes o lentejas (1taza al da).  Sopas, caldos o consom preparados con carne o pescado. Limite estos alimentos lo ms posible. Qu alimentos no se recomiendan? Esta podra no ser Raytheonuna lista completa. Hable con el nutricionista sobre las mejores opciones alimenticias para usted. Limite el consumo de alimentos con alto contenido de purinas, entre Annaellos, los siguientes:  Financial controllerCerveza y Staplesotras bebidas alcohlicas.  Salsas preparadas con carne.  Pescado enlatado o fresco, por ejemplo: ? Anchoa, sardina, arenque y atn. ? Mejillones y vieiras. ? Dennie BibleBacalao, trucha y abadejo.  [place].  Vsceras, por ejemplo: ? Hgado o rin. ? [place]. ? Mollejas (glndula timo o pncreas).  Ganso o animales silvestres.  Levadura o suplementos con extracto de levadura.  Bebidas endulzadas con jarabe de maz de alta fructosa. Resumen  Consumir alimentos con bajo contenido de purinas puede ayudar a controlar afecciones causadas por la presencia excesiva de cido rico en el organismo, por ejemplo, gota o clculos renales.  Opte por alimentos con bajo contenido de purinas, y limite la ingesta de alcohol y de alimentos con Germanymucha grasa.  Con el tiempo, aprender a Capital Onereconocer los alimentos que lo afectan o no. Si descubre que un alimento tiende a causarle una exacerbacin de la gota, evite comerlo. Esta informacin no tiene Theme park managercomo fin reemplazar el consejo del mdico. Asegrese de hacerle al mdico cualquier pregunta que tenga. Document Released: 06/29/2012 Document Revised: 05/14/2017 Document Reviewed: 05/14/2017 Elsevier Interactive Patient Education  2019 [employer]  Gota Gout  La gota es una afeccin que causa la hinchazn dolorosa de las articulaciones. La gota es un tipo de inflamacin de las articulaciones (artritis). Esta afeccin es causada por la acumulacin de cido rico en el cuerpo. El cido rico es una  sustancia qumica que se forma cuando el cuerpo descompone sustancias llamadas purinas. Las purinas son importantes para la fabricacin de las protenas del cuerpo. Cuando el cuerpo tiene un exceso de cido rico, se pueden formar cristales con punta y Production manageracumularse en el interior de las articulaciones. Esto provoca dolor e hinchazn. Las crisis de gota pueden ocurrir rpidamente y ser muy dolorosas (gota [place]uda). Con el tiempo, las crisis pueden afectar ms articulaciones y volverse ms frecuentes (gota crnica). La gota tambin puede provocar la acumulacin de cido rico debajo de la piel y en los riones. Cules son las causas? Esta afeccin es causada por la acumulacin de cido rico en la sangre. Esto puede ocurrir debido a lo siguiente:  Los riones no Anheuser-Buscheliminan la cantidad suficiente de cido rico de Risk managerla sangre. Esta es la causa ms frecuente.  El cuerpo produce demasiado cido rico. Esto puede ocurrir con algunos tipos de cncer y tratamientos para Management consultantel cncer. Tambin puede ocurrir si el cuerpo est descomponiendo demasiados glbulos rojos (anemia hemoltica).  Come demasiados alimentos ricos en purinas. Estos alimentos incluyen vsceras y Agilent Technologiesalgunos mariscos. Las bebidas alcohlicas, en especial la cerveza, tambin son ricas en purinas. Neomia DearUna crisis de gota puede desencadenarse debido a un traumatismo o Librarian, academicestrs. Qu incrementa el riesgo? Es ms probable que tenga esta afeccin si:  Tiene antecedentes familiares de gota.  Es hombre de Fernando Salinasmediana edad.  Es mujer que ha atravesado la menopausia.  Tiene obesidad.  Bebe alcohol  con frecuencia, en especial, cerveza.  Est deshidratado.  Pierde peso con demasiada rapidez.  Le han hecho un trasplante de un rgano.  Se ha intoxicado con plomo.  [place] determinados medicamentos, como aspirina, ciclosporina, diurticos, levodopa y niacina.  Tiene enfermedades renales.  Tiene una afeccin de la piel llamada psoriasis. Cules son los signos o  los sntomas? Un ataque de gota aguda sucede de repente. Normalmente ocurre solo en Risk analystuna articulacin. El lugar ms comn es el pulgar del pie. Las crisis a menudo comienzan por la noche. Tambin pueden verse afectadas las articulaciones de los pies, los tobillos, las rodillas, los dedos de la mano, las muecas o los codos. Los sntomas de esta afeccin pueden incluir los siguientes:  Dolor intenso.  Calor.  Hinchazn.  Entumecimiento.  Dolor a Insurance claims handlerla palpacin. Se puede sentir mucho dolor al tacto en la articulacin afectada.  Piel brillosa, roja o morada.  Grant RutsFiebre y escalofros. La gota crnica puede provocar sntomas con ms frecuencia. Pueden verse involucradas ms articulaciones. Es posible que tambin tenga bultos blancos o amarillos (tofos) en las manos o los pies, o en otras zonas cercanas a las articulaciones. Cmo se diagnostica? Esta afeccin se diagnostica en funcin de los sntomas, los antecedentes mdicos y un examen fsico. Pueden hacerle estudios, por ejemplo:  Anlisis de sangre para Foot Lockermedir los niveles de cido rico.  Psychologist, counsellingxtraccin de lquido sinovial con una aguja delgada (aspiracin) para buscar la presencia de cristales de cido rico.  Radiografas para observar la articulacin daada. Cmo se trata? El tratamiento para esta afeccin tiene dos fases: tratar un ataque agudo y prevenir ataques futuros. El tratamiento de la gota aguda puede incluir medicamentos para reducir Chief Technology Officerel dolor y la hinchazn, por ejemplo:  Antiinflamatorios no esteroideos (AINE).  Corticoesteroides. Estos son medicamentos antiinflamatorios fuertes que se pueden tomar por va oral por boca o se pueden inyectar en una articulacin.  Colchicina. Este medicamento Estée Lauderalivia el dolor y la hinchazn cuando se [place] inmediatamente despus de Neomia Dearuna crisis. Puede administrarse por la boca o por una va intravenosa. El tratamiento preventivo puede incluir lo siguiente:  El uso diario de dosis ms bajas de AINE o  colchicina.  El uso de un medicamento que reduce los niveles de cido rico en la [place].  Cambios en la dieta. Es posible que deba consultar a un nutricionista acerca de lo que debe comer y beber para Cytogeneticistevitar la gota. Siga estas indicaciones en su casa: Durante una crisis de gota   Si se lo indican, aplique hielo sobre la zona afectada: ? Ponga el hielo en una bolsa plstica. ? Coloque una FirstEnergy Corptoalla entre la piel y [place] bolsa. ? Coloque el hielo durante 20minutos, 2 a 3veces por da.  Levante (eleve) la articulacin afectada por encima del nivel del corazn tantas veces como le sea posible.  Haga reposo todo el tiempo que pueda. Si la articulacin afectada se encuentra en la pierna, quiz deba usar muletas.  Siga las indicaciones del mdico respecto de las restricciones en las comidas o las bebidas. Cmo evitar las crisis de gota en el futuro  Siga una dieta baja en purinas como se lo haya indicado el nutricionista o el mdico. Evite alimentos y bebidas con alto contenido de purinas, por ejemplo, hgado, rin, anchoas, esprragos, arenque, hongos, mejillones y Financial controllercerveza.  Mantenga un peso saludable o pierda peso si tiene sobrepeso. Si quiere perder peso, hable con el mdico. Es importante que no pierda peso demasiado rpido.  Comience o siga un programa de [place] and the Grenadinesactividad  fsica como se lo haya indicado el mdico. Comida y bebida  Beba suficiente lquido para mantener la orina de color amarillo plido.  Si bebe alcohol: ? Limite la cantidad que bebe a lo siguiente:  De 0 a 1 medida por da para las mujeres.  De 0 a 2 medidas por da para los hombres. ? Est atento a la cantidad de alcohol que hay en las bebidas que toma. En los [place], una medida equivale a una botella de cerveza de 12oz ( ), un vaso de vino de 5oz ( ) o un vaso de una bebida alcohlica de alta graduacin de 1oz (9ml). Indicaciones generales  [employer] de venta libre y los recetados  solamente como se lo haya indicado el mdico.  No conduzca ni use maquinaria pesada mientras toma analgsicos recetados.  Retome sus actividades normales segn lo indicado por el mdico. Pregntele al mdico qu actividades son seguras para usted.  Concurra a todas las visitas de control como se lo haya indicado el mdico. Esto es importante. Comunquese con un mdico si tiene:  Otra crisis de gota.  Sntomas de [place] crisis de gota que continan despus de 10das de tratamiento.  Efectos secundarios provocados por los medicamentos.  Escalofros o fiebre.  Dolor urente al ConocoPhillips.  Dolor en la parte inferior de la espalda o en el vientre. Solicite ayuda inmediatamente si:  Siente dolor intenso o incontrolable.  No puede orinar. Resumen  La gota es una hinchazn dolorosa de las articulaciones causada por una inflamacin.  El lugar ms habitual donde se presenta el dolor es el dedo gordo del pie, pero tambin pueden verse afectadas otras articulaciones del cuerpo.  Los [employer] y los cambios en la dieta pueden ayudar a Automotive engineer y tratar las crisis de [place]. Esta informacin no tiene Theme park manager el consejo del mdico. Asegrese de hacerle al mdico cualquier pregunta que tenga. Document Released: 07/15/2005 Document Revised: 06/08/2018 Document Reviewed: 06/08/2018 Elsevier Interactive Patient Education  2019 [employer].  Gota Gout  La gota es una afeccin que causa la hinchazn dolorosa de las articulaciones. La gota es un tipo de inflamacin de las articulaciones (artritis). Esta afeccin es causada por la acumulacin de cido rico en el cuerpo. El cido rico es una sustancia qumica que se forma cuando el cuerpo descompone sustancias llamadas purinas. Las purinas son importantes para la fabricacin de las protenas del cuerpo. Cuando el cuerpo tiene un exceso de cido rico, se pueden formar cristales con punta y Production manager interior de las articulaciones.  Esto provoca dolor e hinchazn. Las crisis de gota pueden ocurrir rpidamente y ser muy dolorosas (gota [place]). Con el tiempo, las crisis pueden afectar ms articulaciones y volverse ms frecuentes (gota crnica). La gota tambin puede provocar la acumulacin de cido rico debajo de la piel y en los riones. Cules son las causas? Esta afeccin es causada por la acumulacin de cido rico en la sangre. Esto puede ocurrir debido a lo siguiente:  Los riones no [employer]heuser-[employer]ch cantidad suficiente de cido rico de Risk manager. Esta es la causa ms frecuente.  El cuerpo produce demasiado cido rico. Esto puede ocurrir con algunos tipos de cncer y tratamientos para Management consultant. Tambin puede ocurrir si el cuerpo est descomponiendo demasiados glbulos rojos (anemia hemoltica).  Come demasiados alimentos ricos en purinas. Estos alimentos incluyen vsceras y [employer]. Las bebidas alcohlicas, en especial la cerveza, tambin son ricas en purinas. Neomia Dear crisis de gota puede desencadenarse debido a un traumatismo  o estrs. Qu incrementa el riesgo? Es ms probable que tenga esta afeccin si:  Tiene antecedentes familiares de gota.  Es hombre de [place].  Es mujer que ha atravesado la menopausia.  Tiene obesidad.  Bebe alcohol con frecuencia, en especial, cerveza.  Est deshidratado.  Pierde peso con demasiada rapidez.  Le han hecho un trasplante de un rgano.  Se ha intoxicado con plomo.  [place] determinados medicamentos, como aspirina, ciclosporina, diurticos, levodopa y niacina.  Tiene enfermedades renales.  Tiene una afeccin de la piel llamada psoriasis. Cules son los signos o los sntomas? Un ataque de gota aguda sucede de repente. Normalmente ocurre solo en Risk analyst. El lugar ms comn es el pulgar del pie. Las crisis a menudo comienzan por la noche. Tambin pueden verse afectadas las articulaciones de los pies, los tobillos, las rodillas, los dedos de la mano, las  muecas o los codos. Los sntomas de esta afeccin pueden incluir los siguientes:  Dolor intenso.  Calor.  Hinchazn.  Entumecimiento.  Dolor a Insurance claims handler. Se puede sentir mucho dolor al tacto en la articulacin afectada.  Piel brillosa, roja o morada.  Grant Ruts y escalofros. La gota crnica puede provocar sntomas con ms frecuencia. Pueden verse involucradas ms articulaciones. Es posible que tambin tenga bultos blancos o amarillos (tofos) en las manos o los pies, o en otras zonas cercanas a las articulaciones. Cmo se diagnostica? Esta afeccin se diagnostica en funcin de los sntomas, los antecedentes mdicos y un examen fsico. Pueden hacerle estudios, por ejemplo:  Anlisis de sangre para [employer] de cido rico.  Psychologist, counselling de lquido sinovial con una aguja delgada (aspiracin) para buscar la presencia de cristales de cido rico.  Radiografas para observar la articulacin daada. Cmo se trata? El tratamiento para esta afeccin tiene dos fases: tratar un ataque agudo y prevenir ataques futuros. El tratamiento de la gota aguda puede incluir medicamentos para reducir Chief Technology Officer y la hinchazn, por ejemplo:  Antiinflamatorios no esteroideos (AINE).  Corticoesteroides. Estos son medicamentos antiinflamatorios fuertes que se pueden tomar por va oral por boca o se pueden inyectar en una articulacin.  Colchicina. Este medicamento Estée Lauder dolor y la hinchazn cuando se [place] inmediatamente despus de Neomia Dear crisis. Puede administrarse por la boca o por una va intravenosa. El tratamiento preventivo puede incluir lo siguiente:  El uso diario de dosis ms bajas de AINE o colchicina.  El uso de un medicamento que reduce los niveles de cido rico en la [place].  Cambios en la dieta. Es posible que deba consultar a un nutricionista acerca de lo que debe comer y beber para Cytogeneticist. Siga estas indicaciones en su casa: Durante una crisis de gota   Si se lo  indican, aplique hielo sobre la zona afectada: ? Ponga el hielo en una bolsa plstica. ? Coloque una [employer] piel y Copy. ? Coloque el hielo durante , 2 a 3veces por da.  Levante (eleve) la articulacin afectada por encima del nivel del corazn tantas veces como le sea posible.  Haga reposo todo el tiempo que pueda. Si la articulacin afectada se encuentra en la pierna, quiz deba usar muletas.  Siga las indicaciones del mdico respecto de las restricciones en las comidas o las bebidas. Cmo evitar las crisis de gota en el futuro  Siga una dieta baja en purinas como se lo haya indicado el nutricionista o el mdico. Evite alimentos y bebidas con alto contenido de purinas, por ejemplo, hgado, rin, anchoas, esprragos,  arenque, hongos, mejillones y cerveza.  Mantenga un peso saludable o pierda peso si tiene sobrepeso. Si quiere perder peso, hable con el mdico. Es importante que no pierda peso demasiado rpido.  Comience o siga un programa de actividad fsica como se lo haya indicado el mdico. Comida y bebida  Beba suficiente lquido para Pharmacologist la orina de color amarillo plido.  Si bebe alcohol: ? Limite la cantidad que bebe a lo siguiente:  De 0 a 1 medida por da para las mujeres.  De 0 a 2 medidas por da para los hombres. ? Est atento a la cantidad de alcohol que hay en las bebidas que toma. En los [place], una medida equivale a una botella de cerveza de 12oz ( ), un vaso de vino de 5oz ( ) o un vaso de una bebida alcohlica de alta graduacin de 1oz (44ml). Indicaciones generales  [employer] de venta libre y los recetados solamente como se lo haya indicado el mdico.  No conduzca ni use maquinaria pesada mientras toma analgsicos recetados.  Retome sus actividades normales segn lo indicado por el mdico. Pregntele al mdico qu actividades son seguras para usted.  Concurra a todas las visitas de control como se  lo haya indicado el mdico. Esto es importante. Comunquese con un mdico si tiene:  Otra crisis de gota.  Sntomas de [place] crisis de gota que continan despus de 10das de tratamiento.  Efectos secundarios provocados por los medicamentos.  Escalofros o fiebre.  Dolor urente al [employer]ocoPhillips.  Dolor en la parte inferior de la espalda o en el vientre. Solicite ayuda inmediatamente si:  Siente dolor intenso o incontrolable.  No puede orinar. Resumen  La gota es una hinchazn dolorosa de las articulaciones causada por una inflamacin.  El lugar ms habitual donde se presenta el dolor es el dedo gordo del pie, pero tambin pueden verse afectadas otras articulaciones del cuerpo.  Los [employer] y los cambios en la dieta pueden ayudar a Automotive engineer y tratar las crisis de [place]. Esta informacin no tiene Theme park manager el consejo del mdico. Asegrese de hacerle al mdico cualquier pregunta que tenga. Document Released: 07/15/2005 Document Revised: 06/08/2018 Document Reviewed: 06/08/2018 Elsevier Interactive Patient Education  2019 [employer].

## 2018-10-25 NOTE — Progress Notes (Signed)
Assessment & Plan:  Daniel Klein was seen today for establish care.  Diagnoses and all orders for this visit:  Idiopathic chronic gout of right foot without tophus -     Uric Acid -     CBC -     CMP14+EGFR -     DG Foot Complete Right; Future -     colchicine 0.6 MG tablet; Take 1 tablet (0.6 mg total) by mouth daily.    Patient has been counseled on age-appropriate routine health concerns for screening and prevention. These are reviewed and up-to-date. Referrals have been placed accordingly. Immunizations are up-to-date or declined.    Subjective:   Chief Complaint  Patient presents with  . Establish Care   HPI Daniel Klein 33 y.o. male presents to office today to establish care. VRI was used to communicate directly with patient for the entire encounter including providing detailed patient instructions. He is accompanied by his wife.   Gout Patient here for evaluation of chronic gout. The patient reports no acute gout attacks since last clinic visit. Attacks occur primarily in the bilateral feet. Patient reports his chronic pain is ongoing, his joint stiffness is unchanged and his joint swelling is resolved. Limitation on activities include none. The patient is not avoiding high purine foods. Chronic. He has complaints of right foot pain which he reports is related to his back pain. However his uric acid level is elevated and has been elevated for quite some time now. He endorses taking allopurinol 200 mg daily as prescribed. Ortho had prescribed him colchicine and allopurinol in December however his is currently only taking allopurinol and states he was never received the prescription for allopurinol.    Review of Systems  Constitutional: Negative for fever, malaise/fatigue and weight loss.  HENT: Negative.  Negative for nosebleeds.   Eyes: Negative.  Negative for blurred vision, double vision and photophobia.  Respiratory: Negative.  Negative for cough and shortness of  breath.   Cardiovascular: Negative.  Negative for chest pain, palpitations and leg swelling.  Gastrointestinal: Negative.  Negative for heartburn, nausea and vomiting.  Musculoskeletal: Positive for back pain and joint pain. Negative for myalgias.  Neurological: Negative.  Negative for dizziness, focal weakness, seizures and headaches.  Psychiatric/Behavioral: Negative.  Negative for suicidal ideas.    History reviewed. No pertinent past medical history.  Past Surgical History:  Procedure Laterality Date  . APPENDECTOMY      Family History  Problem Relation Age of Onset  . Hypertension Mother   . Cancer Brother     Social History Reviewed with no changes to be made today.   Outpatient Medications Prior to Visit  Medication Sig Dispense Refill  . allopurinol (ZYLOPRIM) 100 MG tablet Take 1 tablet (100 mg total) by mouth 2 (two) times daily. 60 tablet 3  . ibuprofen (ADVIL,MOTRIN) 800 MG tablet Take 1 tablet (800 mg total) by mouth every 8 (eight) hours as needed (Take with food.). 60 tablet 0  . albuterol (PROVENTIL HFA;VENTOLIN HFA) 108 (90 BASE) MCG/ACT inhaler Inhale 2 puffs into the lungs every 4 (four) hours as needed for wheezing or shortness of breath. (Patient not taking: Reported on 10/26/2017) 1 Inhaler 0  . colchicine 0.6 MG tablet Take 1 tablet (0.6 mg total) by mouth daily. (Patient not taking: Reported on 10/25/2018) 60 tablet 12  . febuxostat (ULORIC) 40 MG tablet Take 2 tablets (80 mg total) by mouth daily. (Patient not taking: Reported on 10/26/2017) 30 tablet 3  . predniSONE (DELTASONE) 50  MG tablet Take 50 mg by mouth once daily (Patient not taking: Reported on 10/26/2017) 5 tablet 0  . traMADol (ULTRAM) 50 MG tablet Take 1 tablet (50 mg total) by mouth 2 (two) times daily. (Patient not taking: Reported on 10/25/2018) 40 tablet 0   No facility-administered medications prior to visit.     Allergies  Allergen Reactions  . Shellfish Allergy Hives       Objective:      BP 121/77   Pulse 66   Temp 97.9 F (36.6 C) (Oral)   Ht '5\' 10"'  (1.778 m)   Wt 239 lb 12.8 oz (108.8 kg)   SpO2 97%   BMI 34.41 kg/m  Wt Readings from Last 3 Encounters:  10/25/18 239 lb 12.8 oz (108.8 kg)  08/09/18 220 lb (99.8 kg)  06/15/17 220 lb (99.8 kg)    Physical Exam Vitals signs and nursing note reviewed.  Constitutional:      Appearance: He is well-developed.  HENT:     Head: Normocephalic and atraumatic.  Neck:     Musculoskeletal: Normal range of motion.  Cardiovascular:     Rate and Rhythm: Normal rate and regular rhythm.     Heart sounds: Normal heart sounds. No murmur. No friction rub. No gallop.   Pulmonary:     Effort: Pulmonary effort is normal. No tachypnea or respiratory distress.     Breath sounds: Normal breath sounds. No decreased breath sounds, wheezing, rhonchi or rales.  Chest:     Chest wall: No tenderness.  Abdominal:     General: Bowel sounds are normal.     Palpations: Abdomen is soft.  Musculoskeletal: Normal range of motion.     Right foot: Normal range of motion.     Left foot: Normal range of motion.  Skin:    General: Skin is warm and dry.  Neurological:     Mental Status: He is alert and oriented to person, place, and time.     Coordination: Coordination normal.  Psychiatric:        Behavior: Behavior normal. Behavior is cooperative.        Thought Content: Thought content normal.        Judgment: Judgment normal.        Patient has been counseled extensively about nutrition and exercise as well as the importance of adherence with medications and regular follow-up. The patient was given clear instructions to go to ER or return to medical center if symptoms don't improve, worsen or new problems develop. The patient verbalized understanding.   Follow-up: Return in about 8 weeks (around 12/20/2018) for gout uric acid , Needs appointment with financial representative.Gildardo Pounds, FNP-BC [facility] and  [facility] [place], [place]   10/26/2018, 8:44 AM

## 2018-10-26 ENCOUNTER — Telehealth (INDEPENDENT_AMBULATORY_CARE_PROVIDER_SITE_OTHER): Payer: Self-pay

## 2018-10-26 ENCOUNTER — Encounter: Payer: Self-pay | Admitting: Nurse Practitioner

## 2018-10-26 LAB — CMP14+EGFR
ALBUMIN: 4.8 g/dL (ref 3.5–5.5)
ALK PHOS: 82 IU/L (ref 39–117)
ALT: 77 IU/L — ABNORMAL HIGH (ref 0–44)
AST: 38 IU/L (ref 0–40)
Albumin/Globulin Ratio: 1.7 (ref 1.2–2.2)
BUN / CREAT RATIO: 14 (ref 9–20)
BUN: 11 mg/dL (ref 6–20)
Bilirubin Total: 0.6 mg/dL (ref 0.0–1.2)
CO2: 25 mmol/L (ref 20–29)
CREATININE: 0.79 mg/dL (ref 0.76–1.27)
Calcium: 9.8 mg/dL (ref 8.7–10.2)
Chloride: 101 mmol/L (ref 96–106)
GFR calc non Af Amer: 117 mL/min/{1.73_m2} (ref >59)
GFR, EST AFRICAN AMERICAN: 135 mL/min/{1.73_m2} (ref >59)
GLOBULIN, TOTAL: 2.8 g/dL (ref 1.5–4.5)
GLUCOSE: 94 mg/dL (ref 65–99)
Potassium: 4.3 mmol/L (ref 3.5–5.2)
SODIUM: 142 mmol/L (ref 134–144)
Total Protein: 7.6 g/dL (ref 6.0–8.5)

## 2018-10-26 LAB — CBC
HEMATOCRIT: 50.2 % (ref 37.5–51.0)
HEMOGLOBIN: 17.4 g/dL (ref 13.0–17.7)
MCH: 30.9 pg (ref 26.6–33.0)
MCHC: 34.7 g/dL (ref 31.5–35.7)
MCV: 89 fL (ref 79–97)
Platelets: 253 10*3/uL (ref 150–450)
RBC: 5.63 x10E6/uL (ref 4.14–5.80)
RDW: 12.8 % (ref 11.6–15.4)
WBC: 6 10*3/uL (ref 3.4–10.8)

## 2018-10-26 LAB — URIC ACID: URIC ACID: 5.7 mg/dL (ref 3.7–8.6)

## 2018-10-26 NOTE — Telephone Encounter (Signed)
Call placed using pacific interpreter [phone]) patient was unavailable. His wife is aware that his uric acid is normal and for him to continue taking allopurinol 200 mg as prescribed. Liver and kidney function are normal. She will inform patient of results. Maryjean Morn, CMA

## 2018-10-26 NOTE — Telephone Encounter (Signed)
-----   Message from Claiborne Rigg, NP sent at 10/26/2018  9:09 AM EST ----- Uric acid is normal. Continue allopurinol 200 mg as prescribed. Liver and kidney function are normal.

## 2018-11-09 MED FILL — ALLOPURINOL 100 MG TABLET: 100 | 30 days supply | Qty: 60 | Fill #1

## 2018-11-23 ENCOUNTER — Ambulatory Visit: Payer: Self-pay | Attending: Family Medicine

## 2018-12-09 MED FILL — ALLOPURINOL 100 MG TABLET: 100 | 30 days supply | Qty: 60 | Fill #2

## 2018-12-09 MED FILL — !COLCRYS 0.6 MG TABLET: 0.6 MG | 10 days supply | Qty: 10 | Fill #1

## 2018-12-19 ENCOUNTER — Ambulatory Visit: Payer: Self-pay | Admitting: Nurse Practitioner

## 2019-02-07 MED FILL — !COLCRYS 0.6 MG TABLET: 0.6 MG | 10 days supply | Qty: 10 | Fill #2

## 2019-02-07 MED FILL — ALLOPURINOL 100 MG TABLET: 100 | 30 days supply | Qty: 60 | Fill #3

## 2019-04-25 ENCOUNTER — Telehealth: Payer: Self-pay | Admitting: Nurse Practitioner

## 2019-04-25 NOTE — Telephone Encounter (Signed)
Requesting information on covid testing process. Instructed to call PCP, [name], NP for referral. Verbalizes understanding.

## 2019-04-26 ENCOUNTER — Other Ambulatory Visit: Payer: Self-pay

## 2019-04-26 ENCOUNTER — Emergency Department (HOSPITAL_COMMUNITY): Payer: HRSA Program

## 2019-04-26 ENCOUNTER — Emergency Department (HOSPITAL_COMMUNITY)
Admission: EM | Admit: 2019-04-26 | Discharge: 2019-04-26 | Disposition: A | Discharge status: Home / Self Care | Payer: HRSA Program | Attending: Emergency Medicine | Admitting: Emergency Medicine

## 2019-04-26 ENCOUNTER — Encounter (HOSPITAL_COMMUNITY): Payer: Self-pay

## 2019-04-26 DIAGNOSIS — U071 COVID-19: Secondary | ICD-10-CM | POA: Diagnosis not present

## 2019-04-26 DIAGNOSIS — R197 Diarrhea, unspecified: Secondary | ICD-10-CM | POA: Insufficient documentation

## 2019-04-26 DIAGNOSIS — M7918 Myalgia, other site: Secondary | ICD-10-CM | POA: Insufficient documentation

## 2019-04-26 DIAGNOSIS — R509 Fever, unspecified: Secondary | ICD-10-CM | POA: Diagnosis present

## 2019-04-26 DIAGNOSIS — R51 Headache: Secondary | ICD-10-CM | POA: Diagnosis not present

## 2019-04-26 DIAGNOSIS — Z20822 Contact with and (suspected) exposure to covid-19: Secondary | ICD-10-CM

## 2019-04-26 DIAGNOSIS — R05 Cough: Secondary | ICD-10-CM | POA: Diagnosis not present

## 2019-04-26 MED ORDER — ACETAMINOPHEN 500 MG PO TABS
1000.0000 mg | ORAL_TABLET | Freq: Once | ORAL | Status: AC
  Administered 2019-04-26: 1000 mg via ORAL
  Filled 2019-04-26: qty 2

## 2019-04-26 MED ORDER — ALBUTEROL SULFATE HFA 108 (90 BASE) MCG/ACT IN AERS
2.0000 | INHALATION_SPRAY | Freq: Once | RESPIRATORY_TRACT | Status: AC
  Administered 2019-04-26: 2 via RESPIRATORY_TRACT
  Filled 2019-04-26: qty 6.7

## 2019-04-26 NOTE — ED Notes (Signed)
Patient verbalizes understanding of discharge instructions. Opportunity for questioning and answering were provided.  patient discharged from ED.  

## 2019-04-26 NOTE — ED Triage Notes (Signed)
Pt reports fever, headache and generalized body aches since Friday. Pt exposed to family member who is positive for COVID. Pt a.o, nad noted.

## 2019-04-26 NOTE — Discharge Instructions (Signed)
You likely have the coronavirus, you have a test pending and will be contacted with results in 2 days.  Please continue to quarantine at home and monitor your symptoms closely. You chest x-ray was clear. Antibiotics are not helpful in treating viral infection, the virus should run its course in about 5-7 days. Please make sure you are drinking plenty of fluids. You can treat your symptoms supportively with tylenol for fevers and pains, and over the counter cough syrups and throat lozenges to help with cough. If your symptoms are not improving please follow up with you Primary doctor.   I recommend that you purchase a home pulse ox to help better monitor your oxygen at home, if you start to have increased work of breathing or shortness of breath or your oxygen drops below 90% please immediately return to the hospital for reevaluation.  If you develop persistent fevers, shortness of breath or difficulty breathing, chest pain, severe headache and neck pain, persistent nausea and vomiting or other new or concerning symptoms return to the Emergency department.

## 2019-04-26 NOTE — ED Provider Notes (Signed)
[facility] EMERGENCY DEPARTMENT Provider Note   CSN: [id number] Arrival date & time: 04/26/19  1509    History   Chief Complaint Chief Complaint  Patient presents with  . Fever  . Generalized Body Aches  . Cough    HPI Daniel Klein is a 33 y.o. male.     Daniel Klein is a 34 y.o. male with a history of gout, who presents to the emergency department for evaluation of fevers, body aches, headache, cough and diarrhea.  Symptoms started on Friday.  Patient reports he found out yesterday that 1 of his family members he has been in close contact with tested positive for coronavirus.  His wife is here with similar symptoms as well.  He reports frequent nonproductive cough.  Denies shortness of breath or chest pain.  No abdominal pain.  He has not taken anything for his symptoms prior to arrival, no other aggravating or alleviating factors.  Patient is Spanish speaking, history obtained using [employer] video interpreter.     History reviewed. No pertinent past medical history.  Patient Active Problem List   Diagnosis Date Noted  . Idiopathic chronic gout, unspecified site, without tophus (tophi) 04/14/2017  . Arthralgia of right ankle 02/09/2017  . Right leg pain 01/11/2017    Past Surgical History:  Procedure Laterality Date  . APPENDECTOMY          Home Medications    Prior to Admission medications   Medication Sig Start Date End Date Taking? Authorizing Provider  albuterol (PROVENTIL HFA;VENTOLIN HFA) 108 (90 BASE) MCG/ACT inhaler Inhale 2 puffs into the lungs every 4 (four) hours as needed for wheezing or shortness of breath. Patient not taking: Reported on 10/26/2017 08/30/14   Al Corpus, PA-C  allopurinol (ZYLOPRIM) 100 MG tablet Take 1 tablet (100 mg total) by mouth 2 (two) times daily. 09/22/18   Newt Minion, MD  colchicine 0.6 MG tablet Take 1 tablet (0.6 mg total) by mouth daily. 10/25/18   Gildardo Pounds, NP     Family History Family History  Problem Relation Age of Onset  . Hypertension Mother   . Cancer Brother     Social History Social History   Tobacco Use  . Smoking status: Never Smoker  . Smokeless tobacco: Never Used  Substance Use Topics  . Alcohol use: Yes    Comment: occ  . Drug use: Not on file     Allergies   Shellfish allergy   Review of Systems Review of Systems  Constitutional: Positive for chills and fever.  HENT: Negative for congestion and sore throat.   Eyes: Negative for visual disturbance.  Respiratory: Positive for cough. Negative for shortness of breath.   Cardiovascular: Negative for chest pain.  Gastrointestinal: Positive for diarrhea. Negative for abdominal pain, constipation, nausea and vomiting.  Musculoskeletal: Positive for myalgias. Negative for arthralgias.  Skin: Negative for color change and rash.  Neurological: Positive for headaches. Negative for dizziness, syncope and light-headedness.     Physical Exam Updated Vital Signs BP (!) 150/91 (BP Location: Left Arm)   Pulse 66   Temp 98.7 F (37.1 C) (Oral)   Resp 17   Ht 5\' 10"  (1.778 m)   Wt 104.3 kg   SpO2 90%   BMI 33.00 kg/m   Physical Exam Vitals signs and nursing note reviewed.  Constitutional:      General: He is not in acute distress.    Appearance: Normal appearance. He is well-developed and normal weight. He  is not ill-appearing or diaphoretic.  HENT:     Head: Normocephalic and atraumatic.     Nose: Nose normal.     Mouth/Throat:     Mouth: Mucous membranes are moist.     Pharynx: Oropharynx is clear.     Comments: Posterior oropharynx clear without erythema, edema or exudates Eyes:     General:        Right eye: No discharge.        Left eye: No discharge.     Pupils: Pupils are equal, round, and reactive to light.  Neck:     Musculoskeletal: Neck supple.     Comments: No rigidity Cardiovascular:     Rate and Rhythm: Normal rate and regular rhythm.     Heart  sounds: Normal heart sounds.  Pulmonary:     Effort: Pulmonary effort is normal. No respiratory distress.     Breath sounds: Normal breath sounds. No wheezing or rales.     Comments: Respirations equal and unlabored, patient able to speak in full sentences, lungs clear to auscultation bilaterally Abdominal:     General: Bowel sounds are normal. There is no distension.     Palpations: Abdomen is soft. There is no mass.     Tenderness: There is no abdominal tenderness. There is no guarding.     Comments: Abdomen soft, nondistended, nontender to palpation in all quadrants without guarding or peritoneal signs  Musculoskeletal:        General: No deformity.  Lymphadenopathy:     Cervical: No cervical adenopathy.  Skin:    General: Skin is warm and dry.     Capillary Refill: Capillary refill takes less than 2 seconds.  Neurological:     Mental Status: He is alert.     Coordination: Coordination normal.     Comments: Speech is clear, able to follow commands Moves extremities without ataxia, coordination intact   Psychiatric:        Mood and Affect: Mood normal.        Behavior: Behavior normal.      ED Treatments / Results  Labs (all labs ordered are listed, but only abnormal results are displayed) Labs Reviewed  NOVEL CORONAVIRUS, NAA (HOSPITAL ORDER, SEND-OUT TO REF LAB)    EKG None  Radiology Dg Chest Port 1 View  Result Date: 04/26/2019 CLINICAL DATA:  Cough and fever with recent COVID-19 exposure EXAM: PORTABLE CHEST 1 VIEW COMPARISON:  08/30/2014 FINDINGS: Cardiac shadow is stable. The lungs are well aerated bilaterally. No focal infiltrate or sizable effusion is seen. No bony abnormality is noted. IMPRESSION: No active disease. Electronically Signed   By: Alcide CleverMark  Lukens M.D.   On: 04/26/2019 17:22    Procedures Procedures (including critical care time)  Medications Ordered in ED Medications  acetaminophen (TYLENOL) tablet 1,000 mg (1,000 mg Oral Given 04/26/19 1702)   albuterol (VENTOLIN HFA) 108 (90 Base) MCG/ACT inhaler 2 puff (2 puffs Inhalation Given 04/26/19 1702)     Initial Impression / Assessment and Plan / ED Course  I have reviewed the triage vital signs and the nursing notes.  Pertinent labs & imaging results that were available during my care of the patient were reviewed by me and considered in my medical decision making (see chart for details).  Patient presents with fevers, body aches, cough, headaches and diarrhea since Friday.  Known contact with COVID positive family member.  On arrival vitals look good and patient is not requiring any oxygen.  Lungs clear on exam.  Suspect patient has coronavirus, outpatient test sent.  Chest x-ray completed here today which is clear.  Patient satting at 100% on my evaluation when asked to walk around in the room with no labored breathing.  Discussed appropriate symptomatic treatment at home and home quarantine guidelines, encourage patient to purchase home pulse ox to monitor for worsening symptoms.  Strict return precautions discussed.  Patient expresses understanding and agreement with plan.  Discharged home in good condition.  BP 124/74 (BP Location: Left Arm)   Pulse (!) 53   Temp 98.2 F (36.8 C) (Oral)   Resp 16   Ht 5\' 10"  (1.778 m)   Wt 104.3 kg   SpO2 100%   BMI 33.00 kg/m   [name] was evaluated in Emergency Department on 04/26/2019 for the symptoms described in the history of present illness. He was evaluated in the context of the global COVID-19 pandemic, which necessitated consideration that the patient might be at risk for infection with the SARS-CoV-2 virus that causes COVID-19. Institutional protocols and algorithms that pertain to the evaluation of patients at risk for COVID-19 are in a state of rapid change based on information released by regulatory bodies including the CDC and federal and state organizations. These policies and algorithms were followed during the patient's  care in the ED.   Final Clinical Impressions(s) / ED Diagnoses   Final diagnoses:  Suspected Covid-19 Virus Infection    ED Discharge Orders    None       Dartha LodgeFord, Tersea Aulds N, [place] JerseyPA-C 04/26/19 Myrtis [id number]    Schlossman, Erin, MD 04/28/19 [phone]

## 2019-04-28 LAB — NOVEL CORONAVIRUS, NAA (HOSP ORDER, SEND-OUT TO REF LAB; TAT 18-24 HRS): SARS-CoV-2, NAA: DETECTED — AB

## 2019-05-17 MED FILL — !COLCRYS 0.6 MG TABLET: 0.6 MG | 10 days supply | Qty: 10 | Fill #3

## 2019-05-17 MED FILL — ALLOPURINOL 100 MG TABLET: 100 | 30 days supply | Qty: 60 | Fill #1

## 2019-06-19 MED FILL — ALLOPURINOL 100 MG TABLET: 100 | 30 days supply | Qty: 60 | Fill #2

## 2019-06-19 MED FILL — !COLCRYS 0.6 MG TABLET: 0.6 MG | 10 days supply | Qty: 10 | Fill #4

## 2019-07-20 MED FILL — ALLOPURINOL 100 MG TABLET: 100 | 30 days supply | Qty: 60 | Fill #3

## 2019-07-20 MED FILL — !COLCRYS 0.6 MG TABLET: 0.6 MG | 10 days supply | Qty: 10 | Fill #5

## 2019-08-11 SURGERY — Surgical Case
Anesthesia: *Unknown

## 2019-10-17 ENCOUNTER — Encounter (INDEPENDENT_AMBULATORY_CARE_PROVIDER_SITE_OTHER): Payer: Self-pay | Admitting: Physician Assistant

## 2019-10-25 ENCOUNTER — Other Ambulatory Visit: Payer: Self-pay

## 2019-10-25 ENCOUNTER — Ambulatory Visit: Payer: Self-pay | Attending: Nurse Practitioner

## 2019-11-13 ENCOUNTER — Ambulatory Visit: Payer: Self-pay | Attending: Nurse Practitioner | Admitting: Nurse Practitioner

## 2023-02-17 ENCOUNTER — Encounter (HOSPITAL_COMMUNITY): Payer: Self-pay

## 2023-02-17 DIAGNOSIS — Z23 Encounter for immunization: Secondary | ICD-10-CM

## 2023-02-17 DIAGNOSIS — M25562 Pain in left knee: Secondary | ICD-10-CM | POA: Insufficient documentation

## 2023-02-17 NOTE — ED Provider Notes (Signed)
[facility] CARE CENTER   [id number] 02/17/23 Arrival Time: 1047  ASSESSMENT & PLAN:  1. Acute pain of left knee    -Etiology of symptoms is difficult to ascertain.  The joint does not seem to be infected clinically.  I do have gout higher on my differential.  I performed an aspiration of the knee and was able to aspirate 10 cc of synovial fluid which I will send to the lab for culture, cell count, and crystal analysis.  Will treat him empirically as a gout flare with prednisone taper.  Will also update his tetanus.  All questions were answered and he agrees to plan.  Procedure performed:  Left Knee Aspiration  Consent obtained and verified. Time-out conducted. Noted no overlying erythema, induration, or other signs of local infection. The left anterior lateral joint space was palpated and marked. The overlying skin was prepped in a sterile fashion.  A 25-gauge needle was used to infiltrate the skin with 3 cc of 2% lidocaine for local anesthetic. An 18-gauge needle was then inserted from anterior lateral approach into the suprapatellar pouch and 10 cc of yellow synovial fluid were aspirated.  I do not see any crystals in the fluid.  Will send it to the lab.  Advised to call if fevers/chills, erythema, induration, drainage, or persistent bleeding.    Meds ordered this encounter  Medications   Tdap (BOOSTRIX) injection 0.5 mL   predniSONE (STERAPRED UNI-PAK 21 TAB) 10 MG (21) TBPK tablet    Sig: Take by mouth daily. Take 6 tabs by mouth daily  for 2 days, then 5 tabs for 2 days, then 4 tabs for 2 days, then 3 tabs for 2 days, 2 tabs for 2 days, then 1 tab by mouth daily for 2 days    Dispense:  42 tablet    Refill:  0   Discharge Instructions   None       Reviewed expectations re: course of current medical issues. Questions answered. Outlined signs and symptoms indicating need for more acute intervention. Patient verbalized understanding. After Visit Summary  given.   SUBJECTIVE: This patient interaction was aided by the use of a Spanish interpreter on the iPad  Pleasant 37 year old male comes to urgent care to be evaluated for left knee pain.  Had 2 days of swelling and pain in the left knee.  He has been struggling to bear weight on it.  Denies any acute injury.  However, about a month ago he did cut his knee with piece of metal on the anterior aspect.  He was never evaluated.  He cleaned the wound from pus and bandaged it himself.  Of note, he does have a history of gout, usually affects his big toe.  Has never had a gout flare in the knee before.  No LMP for male patient. Past Surgical History:  Procedure Laterality Date   APPENDECTOMY       OBJECTIVE:  Vitals:   02/17/23 1129  BP: (!) 149/84  Pulse: (!) 56  Resp: 18  Temp: 98.3 F (36.8 C)  TempSrc: Oral  SpO2: 97%     Physical Exam Vitals reviewed.  Constitutional:      General: He is not in acute distress.    Appearance: Normal appearance.  Cardiovascular:     Rate and Rhythm: Normal rate.  Pulmonary:     Effort: Pulmonary effort is normal.  Musculoskeletal:     Comments: Left knee -there is an effusion present.  The knee is warm.  No erythema.  There is a well-healed scar at the inferior medial aspect of the knee that is not showing any active signs of erythema or drainage or infection.  The knee is tender to palpation at the medial joint line.  Nontender palpation lateral joint line.  Range of motion is limited by pain.  Neurovascularly intact distally.      Labs: Results for orders placed or performed during the hospital encounter of 04/26/19  Novel Coronavirus,NAA,(SEND-OUT TO REF LAB - TAT 24-48 hrs); Hosp Order   Specimen: Nasopharyngeal Swab; Respiratory  Result Value Ref Range   SARS-CoV-2, NAA DETECTED (A) NOT DETECTED   Coronavirus Source NASOPHARYNGEAL    Labs Reviewed  AEROBIC/ANAEROBIC CULTURE W GRAM STAIN (SURGICAL/DEEP WOUND)  SYNOVIAL CELL  COUNT + DIFF, W/ CRYSTALS    Imaging: DG Knee Complete 4 Views Left  Result Date: 02/17/2023 CLINICAL DATA:  Left knee pain for the past 2 days with swelling. No known injury. EXAM: LEFT KNEE - COMPLETE 4+ VIEW COMPARISON:  None Available. FINDINGS: The knee is located. No acute osseous abnormalities are present. No significant joint effusion is present. More superficial soft tissue swelling is noted in the suprapatellar knee. IMPRESSION: 1. More superficial soft tissue swelling in the suprapatellar knee. 2. No acute osseous abnormality. Electronically Signed   By: Marin Roberts M.D.   On: 02/17/2023 12:21     Allergies  Allergen Reactions   Shellfish Allergy Hives                                               History reviewed. No pertinent past medical history.  Social History   Socioeconomic History   Marital status: Married    Spouse name: Not on file   Number of children: Not on file   Years of education: Not on file   Highest education level: Not on file  Occupational History   Not on file  Tobacco Use   Smoking status: Never   Smokeless tobacco: Never  Substance and Sexual Activity   Alcohol use: Not Currently    Comment: occ   Drug use: Not Currently   Sexual activity: Not on file  Other Topics Concern   Not on file  Social History Narrative   Not on file   Social Determinants of Health   Financial Resource Strain: Not on file  Food Insecurity: Not on file  Transportation Needs: Not on file  Physical Activity: Not on file  Stress: Not on file  Social Connections: Not on file  Intimate Partner Violence: Not on file    Family History  Problem Relation Age of Onset   Hypertension Mother    Cancer Brother       Xavior Niazi, Baldemar Friday, MD 02/17/23 1348

## 2023-02-17 NOTE — ED Triage Notes (Addendum)
Pt c/o lt knee pain for past 2 days with swelling. Denies injury. Took something for inflammation with little relief yesterday.  Pts wife states pt cut his knee with metal a month ago.
